# Patient Record
Sex: Female | Born: 1950 | Race: White | Hispanic: No | Smoking: Never smoker
Health system: Southern US, Community
[De-identification: ages and names within clinical notes are randomized; demographics above are authoritative.]

## PROBLEM LIST (undated history)

## (undated) DIAGNOSIS — F0280 Dementia in other diseases classified elsewhere without behavioral disturbance: Secondary | ICD-10-CM

## (undated) DIAGNOSIS — G20 Parkinson's disease: Secondary | ICD-10-CM

## (undated) DIAGNOSIS — G20A1 Parkinson's disease without dyskinesia, without mention of fluctuations: Secondary | ICD-10-CM

## (undated) DIAGNOSIS — G119 Hereditary ataxia, unspecified: Secondary | ICD-10-CM

## (undated) DIAGNOSIS — K759 Inflammatory liver disease, unspecified: Secondary | ICD-10-CM

---

## 2005-12-26 ENCOUNTER — Ambulatory Visit: Payer: Self-pay | Admitting: Psychiatry

## 2006-12-11 ENCOUNTER — Ambulatory Visit: Payer: Self-pay | Admitting: Family Medicine

## 2009-03-29 ENCOUNTER — Ambulatory Visit: Payer: Self-pay

## 2009-05-15 ENCOUNTER — Ambulatory Visit: Payer: Self-pay

## 2010-03-05 ENCOUNTER — Ambulatory Visit: Payer: Self-pay

## 2010-11-21 ENCOUNTER — Ambulatory Visit: Payer: Self-pay

## 2011-03-16 ENCOUNTER — Emergency Department: Payer: Self-pay | Admitting: Emergency Medicine

## 2011-03-27 ENCOUNTER — Ambulatory Visit: Payer: Self-pay | Admitting: Physician Assistant

## 2011-08-12 ENCOUNTER — Ambulatory Visit: Payer: Self-pay

## 2011-12-04 ENCOUNTER — Ambulatory Visit: Payer: Self-pay

## 2011-12-04 LAB — CBC WITH DIFFERENTIAL/PLATELET
Basophil #: 0.1 10*3/uL (ref 0.0–0.1)
Eosinophil #: 1.2 10*3/uL — ABNORMAL HIGH (ref 0.0–0.7)
Lymphocyte #: 3.4 10*3/uL (ref 1.0–3.6)
Lymphocyte %: 20 %
MCHC: 33.9 g/dL (ref 32.0–36.0)
Monocyte %: 10.8 %
Neutrophil %: 61.4 %
Platelet: 466 10*3/uL — ABNORMAL HIGH (ref 150–440)
RBC: 4.43 10*6/uL (ref 3.80–5.20)
RDW: 13.9 % (ref 11.5–14.5)

## 2012-11-30 ENCOUNTER — Ambulatory Visit: Payer: Self-pay | Admitting: Gastroenterology

## 2012-11-30 LAB — CBC WITH DIFFERENTIAL/PLATELET
Basophil #: 0.1 10*3/uL (ref 0.0–0.1)
Basophil %: 0.5 %
Eosinophil #: 0.1 10*3/uL (ref 0.0–0.7)
Eosinophil %: 1 %
HGB: 14.8 g/dL (ref 12.0–16.0)
Lymphocyte %: 14.2 %
MCV: 107 fL — ABNORMAL HIGH (ref 80–100)
Monocyte #: 1.2 x10 3/mm — ABNORMAL HIGH (ref 0.2–0.9)
Neutrophil #: 10.9 10*3/uL — ABNORMAL HIGH (ref 1.4–6.5)
Platelet: 442 10*3/uL — ABNORMAL HIGH (ref 150–440)
RBC: 4.14 10*6/uL (ref 3.80–5.20)
RDW: 13.7 % (ref 11.5–14.5)
WBC: 14.4 10*3/uL — ABNORMAL HIGH (ref 3.6–11.0)

## 2012-11-30 LAB — ALT: SGPT (ALT): 34 U/L (ref 12–78)

## 2013-02-09 ENCOUNTER — Ambulatory Visit: Payer: Self-pay | Admitting: Gastroenterology

## 2018-12-04 ENCOUNTER — Emergency Department
Admission: EM | Admit: 2018-12-04 | Discharge: 2018-12-04 | Discharge status: Absent without leave | Payer: Self-pay | Attending: Emergency Medicine | Admitting: Emergency Medicine

## 2018-12-04 ENCOUNTER — Emergency Department: Payer: Medicare Other

## 2018-12-04 ENCOUNTER — Inpatient Hospital Stay
Admission: EM | Admit: 2018-12-04 | Discharge: 2019-01-04 | DRG: 070 | Disposition: E | Payer: Medicare Other | Attending: Internal Medicine | Admitting: Internal Medicine

## 2018-12-04 ENCOUNTER — Other Ambulatory Visit: Payer: Self-pay

## 2018-12-04 DIAGNOSIS — E8809 Other disorders of plasma-protein metabolism, not elsewhere classified: Secondary | ICD-10-CM | POA: Diagnosis present

## 2018-12-04 DIAGNOSIS — B957 Other staphylococcus as the cause of diseases classified elsewhere: Secondary | ICD-10-CM | POA: Diagnosis present

## 2018-12-04 DIAGNOSIS — C7801 Secondary malignant neoplasm of right lung: Secondary | ICD-10-CM | POA: Diagnosis present

## 2018-12-04 DIAGNOSIS — C799 Secondary malignant neoplasm of unspecified site: Secondary | ICD-10-CM

## 2018-12-04 DIAGNOSIS — R7881 Bacteremia: Secondary | ICD-10-CM | POA: Diagnosis present

## 2018-12-04 DIAGNOSIS — Z66 Do not resuscitate: Secondary | ICD-10-CM | POA: Diagnosis present

## 2018-12-04 DIAGNOSIS — Z681 Body mass index (BMI) 19 or less, adult: Secondary | ICD-10-CM

## 2018-12-04 DIAGNOSIS — R6251 Failure to thrive (child): Secondary | ICD-10-CM | POA: Diagnosis present

## 2018-12-04 DIAGNOSIS — E876 Hypokalemia: Secondary | ICD-10-CM | POA: Diagnosis present

## 2018-12-04 DIAGNOSIS — C801 Malignant (primary) neoplasm, unspecified: Secondary | ICD-10-CM | POA: Diagnosis present

## 2018-12-04 DIAGNOSIS — L8990 Pressure ulcer of unspecified site, unspecified stage: Secondary | ICD-10-CM | POA: Insufficient documentation

## 2018-12-04 DIAGNOSIS — Z515 Encounter for palliative care: Secondary | ICD-10-CM | POA: Diagnosis present

## 2018-12-04 DIAGNOSIS — K5090 Crohn's disease, unspecified, without complications: Secondary | ICD-10-CM | POA: Diagnosis present

## 2018-12-04 DIAGNOSIS — R222 Localized swelling, mass and lump, trunk: Secondary | ICD-10-CM | POA: Diagnosis present

## 2018-12-04 DIAGNOSIS — Z7401 Bed confinement status: Secondary | ICD-10-CM

## 2018-12-04 DIAGNOSIS — L89150 Pressure ulcer of sacral region, unstageable: Secondary | ICD-10-CM | POA: Diagnosis present

## 2018-12-04 DIAGNOSIS — G9341 Metabolic encephalopathy: Secondary | ICD-10-CM | POA: Diagnosis not present

## 2018-12-04 DIAGNOSIS — L89222 Pressure ulcer of left hip, stage 2: Secondary | ICD-10-CM | POA: Diagnosis present

## 2018-12-04 DIAGNOSIS — L89212 Pressure ulcer of right hip, stage 2: Secondary | ICD-10-CM | POA: Diagnosis present

## 2018-12-04 DIAGNOSIS — R7401 Elevation of levels of liver transaminase levels: Secondary | ICD-10-CM | POA: Diagnosis present

## 2018-12-04 DIAGNOSIS — Z7189 Other specified counseling: Secondary | ICD-10-CM

## 2018-12-04 DIAGNOSIS — G20 Parkinson's disease: Secondary | ICD-10-CM | POA: Diagnosis present

## 2018-12-04 DIAGNOSIS — R4182 Altered mental status, unspecified: Secondary | ICD-10-CM | POA: Diagnosis not present

## 2018-12-04 DIAGNOSIS — G309 Alzheimer's disease, unspecified: Secondary | ICD-10-CM | POA: Diagnosis present

## 2018-12-04 DIAGNOSIS — G893 Neoplasm related pain (acute) (chronic): Secondary | ICD-10-CM

## 2018-12-04 DIAGNOSIS — J189 Pneumonia, unspecified organism: Secondary | ICD-10-CM | POA: Diagnosis present

## 2018-12-04 DIAGNOSIS — E43 Unspecified severe protein-calorie malnutrition: Secondary | ICD-10-CM | POA: Diagnosis present

## 2018-12-04 DIAGNOSIS — B182 Chronic viral hepatitis C: Secondary | ICD-10-CM | POA: Diagnosis present

## 2018-12-04 DIAGNOSIS — F0280 Dementia in other diseases classified elsewhere without behavioral disturbance: Secondary | ICD-10-CM | POA: Diagnosis present

## 2018-12-04 DIAGNOSIS — Z20828 Contact with and (suspected) exposure to other viral communicable diseases: Secondary | ICD-10-CM | POA: Diagnosis present

## 2018-12-04 DIAGNOSIS — R627 Adult failure to thrive: Secondary | ICD-10-CM | POA: Diagnosis present

## 2018-12-04 DIAGNOSIS — Z885 Allergy status to narcotic agent status: Secondary | ICD-10-CM

## 2018-12-04 DIAGNOSIS — E860 Dehydration: Secondary | ICD-10-CM | POA: Diagnosis present

## 2018-12-04 HISTORY — DX: Hereditary ataxia, unspecified: G11.9

## 2018-12-04 HISTORY — DX: Inflammatory liver disease, unspecified: K75.9

## 2018-12-04 HISTORY — DX: Dementia in other diseases classified elsewhere, unspecified severity, without behavioral disturbance, psychotic disturbance, mood disturbance, and anxiety: F02.80

## 2018-12-04 HISTORY — DX: Parkinson's disease: G20

## 2018-12-04 HISTORY — DX: Parkinson's disease without dyskinesia, without mention of fluctuations: G20.A1

## 2018-12-04 LAB — CBC WITH DIFFERENTIAL/PLATELET
Abs Immature Granulocytes: 0.13 10*3/uL — ABNORMAL HIGH (ref 0.00–0.07)
Basophils Absolute: 0.1 10*3/uL (ref 0.0–0.1)
Basophils Relative: 0 %
Eosinophils Absolute: 0 10*3/uL (ref 0.0–0.5)
Eosinophils Relative: 0 %
HCT: 43.4 % (ref 36.0–46.0)
Hemoglobin: 14.2 g/dL (ref 12.0–15.0)
Immature Granulocytes: 1 %
Lymphocytes Relative: 5 %
Lymphs Abs: 0.8 10*3/uL (ref 0.7–4.0)
MCH: 34.6 pg — ABNORMAL HIGH (ref 26.0–34.0)
MCHC: 32.7 g/dL (ref 30.0–36.0)
MCV: 105.9 fL — ABNORMAL HIGH (ref 80.0–100.0)
Monocytes Absolute: 1 10*3/uL (ref 0.1–1.0)
Monocytes Relative: 6 %
Neutro Abs: 14.2 10*3/uL — ABNORMAL HIGH (ref 1.7–7.7)
Neutrophils Relative %: 88 %
Platelets: 194 10*3/uL (ref 150–400)
RBC: 4.1 MIL/uL (ref 3.87–5.11)
RDW: 16.4 % — ABNORMAL HIGH (ref 11.5–15.5)
WBC: 16.2 10*3/uL — ABNORMAL HIGH (ref 4.0–10.5)
nRBC: 0 % (ref 0.0–0.2)

## 2018-12-04 LAB — COMPREHENSIVE METABOLIC PANEL
ALT: 6 U/L (ref 0–44)
AST: 50 U/L — ABNORMAL HIGH (ref 15–41)
Albumin: 2.8 g/dL — ABNORMAL LOW (ref 3.5–5.0)
Alkaline Phosphatase: 129 U/L — ABNORMAL HIGH (ref 38–126)
Anion gap: 21 — ABNORMAL HIGH (ref 5–15)
BUN: 23 mg/dL (ref 8–23)
CO2: 21 mmol/L — ABNORMAL LOW (ref 22–32)
Calcium: 12.4 mg/dL — ABNORMAL HIGH (ref 8.9–10.3)
Chloride: 92 mmol/L — ABNORMAL LOW (ref 98–111)
Creatinine, Ser: 0.62 mg/dL (ref 0.44–1.00)
GFR calc Af Amer: >60 mL/min (ref >60)
GFR calc non Af Amer: >60 mL/min (ref >60)
Glucose, Bld: 94 mg/dL (ref 70–99)
Potassium: 4 mmol/L (ref 3.5–5.1)
Sodium: 134 mmol/L — ABNORMAL LOW (ref 135–145)
Total Bilirubin: 1.6 mg/dL — ABNORMAL HIGH (ref 0.3–1.2)
Total Protein: 6.8 g/dL (ref 6.5–8.1)

## 2018-12-04 LAB — CK: Total CK: 49 U/L (ref 38–234)

## 2018-12-04 LAB — SEDIMENTATION RATE: Sed Rate: 13 mm/hr (ref 0–30)

## 2018-12-04 LAB — SARS CORONAVIRUS 2 BY RT PCR (HOSPITAL ORDER, PERFORMED IN ~~LOC~~ HOSPITAL LAB): SARS Coronavirus 2: NEGATIVE

## 2018-12-04 MED ORDER — SODIUM CHLORIDE 0.9 % IV SOLN
1.0000 g | Freq: Once | INTRAVENOUS | Status: AC
  Administered 2018-12-04: 1 g via INTRAVENOUS
  Filled 2018-12-04: qty 1

## 2018-12-04 MED ORDER — IOHEXOL 300 MG/ML  SOLN
50.0000 mL | Freq: Once | INTRAMUSCULAR | Status: AC | PRN
  Administered 2018-12-04: 50 mL via INTRAVENOUS

## 2018-12-04 NOTE — ED Provider Notes (Addendum)
[facility] Emergency Department Provider Note   ____________________________________________   First MD Initiated Contact with Patient 11/26/2018 2011     (approximate)  I have reviewed the triage vital signs and the nursing notes.   HISTORY  Chief Complaint medical clearance Chief complaint is altered mental status this limits my ability to get the patient's history  HPI Sandra Maddox is a 68 y.o. female who comes from home.  Apparently protective services was called.  Patient has end-stage Parkinson's.  Patient has been bedbound.  She has about 4 hours of home health a day.  Bedbugs were present for EMS.  Patient is unable to provide any history due to her altered mental status.  She looks cachectic.   Patient is a no code on hospice care.  She had wanted to die at home but was unable to sign the paperwork and time.  She had been ambulatory for 3 weeks ago.      Past Medical History:  Diagnosis Date   Alzheimer disease ([place])    Cerebellar ataxia ([place])    Hepatitis    Parkinson disease ([place])    Past history is Crohn's disease hepatitis C and end-stage Parkinson's. There are no active problems to display for this patient.     Prior to Admission medications   Not on File    Allergies Codeine and Morphine and related  No family history on file.  Social History Social History   Tobacco Use   Smoking status: Never Smoker   Smokeless tobacco: Never Used  Substance Use Topics   Alcohol use: Not Currently   Drug use: Not Currently    Review of Systems Unable to obtain ____________________________________________   PHYSICAL EXAM:  VITAL SIGNS: ED Triage Vitals  Enc Vitals Group     BP      Pulse      Resp      Temp      Temp src      SpO2      Weight      Height      Head Circumference      Peak Flow      Pain Score      Pain Loc      Pain Edu?      Excl. in [place]?     Constitutional: Alert alert mumbling  but not speaking clearly.  Does not appear to be in any distress. Eyes: Conjunctivae are normal.  Head: Atraumatic. Nose: No congestion/rhinnorhea. Mouth/Throat: Mucous membranes are moist.  Patient not opening mouth well.Marland Kitchen  Poor dentition Neck: No stridor. Cardiovascular: Normal rate, regular rhythm. Grossly normal heart sounds.  Good peripheral circulation. Respiratory: Normal respiratory effort.  No retractions. Lungs CTAB. Gastrointestinal: Soft and nontender. No distention. No abdominal bruits. No CVA tenderness.  Patient has a sacral decubitus with some necrosis.  About the size of the palm of her hand. Musculoskeletal: No lower extremity tenderness nor edema.  No joint effusions.   ____________________________________________   LABS (all labs ordered are listed, but only abnormal results are displayed)  Labs Reviewed  COMPREHENSIVE METABOLIC PANEL - Abnormal; Notable for the following components:      Result Value   Sodium 134 (*)    Chloride 92 (*)    CO2 21 (*)    Calcium 12.4 (*)    Albumin 2.8 (*)    AST 50 (*)    Alkaline Phosphatase 129 (*)    Total Bilirubin 1.6 (*)  Anion gap 21 (*)    All other components within normal limits  CBC WITH DIFFERENTIAL/PLATELET - Abnormal; Notable for the following components:   WBC 16.2 (*)    MCV 105.9 (*)    MCH 34.6 (*)    RDW 16.4 (*)    Neutro Abs 14.2 (*)    Abs Immature Granulocytes 0.13 (*)    All other components within normal limits  SARS CORONAVIRUS 2 BY RT PCR (HOSPITAL ORDER, [place] [facility])  CULTURE, BLOOD (ROUTINE X 2)  CULTURE, BLOOD (ROUTINE X 2)  CK  SEDIMENTATION RATE  URINALYSIS, COMPLETE (UACMP) WITH MICROSCOPIC  URINE DRUG SCREEN, QUALITATIVE ([facility] ONLY)   ____________________________________________  EKG   ____________________________________________  RADIOLOGY  ED MD interpretation:   Official radiology report(s): Ct Head Wo Contrast  Result Date:  11/06/2018 CLINICAL DATA:  Altered level of consciousness. EXAM: CT HEAD WITHOUT CONTRAST TECHNIQUE: Contiguous axial images were obtained from the base of the skull through the vertex without intravenous contrast. COMPARISON:  None. FINDINGS: Brain: No evidence of acute infarction, hemorrhage, hydrocephalus, extra-axial collection or mass lesion/mass effect. There is atrophy greater than expected for the patient's age. Vascular: No hyperdense vessel or unexpected calcification. Skull: Normal. Negative for fracture or focal lesion. Sinuses/Orbits: No acute finding. Other: None. IMPRESSION: No acute intracranial abnormality. Electronically Signed   By: Constance Holster M.D.   On: 11/24/2018 22:39   Ct Chest Wo Contrast  Result Date: 11/04/2018 CLINICAL DATA:  Unintended weight loss, sacral ulcer, nonlocalizing abdominal pain, shortness of breath possible pneumonia EXAM: CT CHEST, ABDOMEN AND PELVIS WITHOUT CONTRAST TECHNIQUE: Multidetector CT imaging of the chest, abdomen and pelvis was performed following the standard protocol without IV contrast. Sagittal and coronal MPR images reconstructed from axial data set. No oral or IV contrast administered. COMPARISON:  Chest radiograph 11/28/2018 FINDINGS: CT CHEST FINDINGS Cardiovascular: Atherosclerotic calcifications aorta, proximal great vessels, minimally in coronary arteries. Heart normal size. No pericardial effusion. Mediastinum/Nodes: Esophagus unremarkable. Base of cervical region normal appearance. No thoracic adenopathy. Lungs/Pleura: Emphysematous changes and central peribronchial thickening consistent with COPD. Large soft tissue mass identified at the posteromedial RIGHT hemithorax, 9.2 x 5.9 x 10.0 cm in size, invading the posterior chest wall over a long segment as well as invading 4 vertebral segments of the thoracic spine. Mass enters the spinal canal and causes significant narrowing and compression of the thecal sac, clinically consider cord  compression. Mild scattered interstitial changes in the lower lobes bilaterally greater on RIGHT, minimally in the anterolateral LEFT upper lobe. Minimal scarring at the anterolateral periphery of the RIGHT middle lobe. No additional infiltrate, pleural effusion or pneumothorax. Musculoskeletal: Bone destruction of the RIGHT seventh eighth ninth and tenth vertebral bodies along the RIGHT side extending across the midline at the seventh eighth and ninth vertebra. Marked compression deformity of the T8 vertebral body. Mild superior endplate compression fracture of the superior endplate of T9. Bone destruction is also seen extending into the posterior elements at T7 and T8 as well as the RIGHT transverse processes of T6, T7, T8, T9. 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 CT ABDOMEN PELVIS FINDINGS Hepatobiliary: Gallbladder mildly distended but otherwise unremarkable. No biliary dilatation. Poorly defined mass at anterolateral liver likely metastatic lesion 4.2 x 2.7 cm image 17. Pancreas: Mildly atrophic pancreas without mass Spleen: Normal appearance Adrenals/Urinary Tract: Adrenal glands normal appearance. Tiny nonobstructing LEFT renal calculus. Kidneys, ureters, and bladder normal appearance Stomach/Bowel: Markedly increased stool in rectum. Stomach decompressed.  Unable to exclude wall thickening of the distal gastric antrum/pyloric region. Remaining bowel loops unremarkable. Vascular/Lymphatic: Atherosclerotic calcifications aorta and extensively and iliac arteries without aneurysm. Significant thrombus in the distal abdominal aorta markedly narrowing the lumen. No adenopathy. Reproductive: Significant fluid distends the upper endometrial canal with thinning of the myometrium. Atrophic ovaries. Other: No free air free fluid. No hernia. Musculoskeletal: Diffuse osseous demineralization. Compression fractures of inferior L1 and superior L2. Scoliosis and degenerative changes of the  lumbar spine. IMPRESSION: Large soft tissue mass at the posteromedial RIGHT hemithorax 9.2 x 5.9 x 10.0 cm in size, invading the posterior chest wall over a long segment, causing destruction of RIGHT seventh through tenth ribs and the RIGHT lateral aspect of the seventh through tenth thoracic vertebra. Extension of tumor into spinal canal causing marked compression and narrowing of the thecal sac, clinically consider cord compression. Suspected metastatic lesion at anterolateral liver 4.2 x 2.7 cm. Markedly increased stool in rectum. Tiny nonobstructing LEFT renal calculus. Significant fluid distends the upper endometrial canal with thinning of the myometrium; this could be assessed by follow-up pelvic sonography, if clinically indicated based on patient condition. Aortic Atherosclerosis (ICD10-I70.0) and Emphysema (ICD10-J43.9). Findings called to Dr. Cinda Quest On 11/25/2018 at 2253 hours. Electronically Signed   By: Lavonia Dana M.D.   On: 11/22/2018 22:57   Ct Abdomen Pelvis W Contrast  Result Date: 11/24/2018 CLINICAL DATA:  Unintended weight loss, sacral ulcer, nonlocalizing abdominal pain, shortness of breath possible pneumonia EXAM: CT CHEST, ABDOMEN AND PELVIS WITHOUT CONTRAST TECHNIQUE: Multidetector CT imaging of the chest, abdomen and pelvis was performed following the standard protocol without IV contrast. Sagittal and coronal MPR images reconstructed from axial data set. No oral or IV contrast administered. COMPARISON:  Chest radiograph 11/04/2018 FINDINGS: CT CHEST FINDINGS Cardiovascular: Atherosclerotic calcifications aorta, proximal great vessels, minimally in coronary arteries. Heart normal size. No pericardial effusion. Mediastinum/Nodes: Esophagus unremarkable. Base of cervical region normal appearance. No thoracic adenopathy. Lungs/Pleura: Emphysematous changes and central peribronchial thickening consistent with COPD. Large soft tissue mass identified at the posteromedial RIGHT hemithorax,  9.2 x 5.9 x 10.0 cm in size, invading the posterior chest wall over a long segment as well as invading 4 vertebral segments of the thoracic spine. Mass enters the spinal canal and causes significant narrowing and compression of the thecal sac, clinically consider cord compression. Mild scattered interstitial changes in the lower lobes bilaterally greater on RIGHT, minimally in the anterolateral LEFT upper lobe. Minimal scarring at the anterolateral periphery of the RIGHT middle lobe. No additional infiltrate, pleural effusion or pneumothorax. Musculoskeletal: Bone destruction of the RIGHT seventh eighth ninth and tenth vertebral bodies along the RIGHT side extending across the midline at the seventh eighth and ninth vertebra. Marked compression deformity of the T8 vertebral body. Mild superior endplate compression fracture of the superior endplate of T9. Bone destruction is also seen extending into the posterior elements at T7 and T8 as well as the RIGHT transverse processes of T6, T7, T8, T9. 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 CT ABDOMEN PELVIS FINDINGS Hepatobiliary: Gallbladder mildly distended but otherwise unremarkable. No biliary dilatation. Poorly defined mass at anterolateral liver likely metastatic lesion 4.2 x 2.7 cm image 17. Pancreas: Mildly atrophic pancreas without mass Spleen: Normal appearance Adrenals/Urinary Tract: Adrenal glands normal appearance. Tiny nonobstructing LEFT renal calculus. Kidneys, ureters, and bladder normal appearance Stomach/Bowel: Markedly increased stool in rectum. Stomach decompressed. Unable to exclude wall thickening of the distal gastric antrum/pyloric region. Remaining bowel loops unremarkable.  Vascular/Lymphatic: Atherosclerotic calcifications aorta and extensively and iliac arteries without aneurysm. Significant thrombus in the distal abdominal aorta markedly narrowing the lumen. No adenopathy. Reproductive: Significant fluid distends the  upper endometrial canal with thinning of the myometrium. Atrophic ovaries. Other: No free air free fluid. No hernia. Musculoskeletal: Diffuse osseous demineralization. Compression fractures of inferior L1 and superior L2. Scoliosis and degenerative changes of the lumbar spine. IMPRESSION: Large soft tissue mass at the posteromedial RIGHT hemithorax 9.2 x 5.9 x 10.0 cm in size, invading the posterior chest wall over a long segment, causing destruction of RIGHT seventh through tenth ribs and the RIGHT lateral aspect of the seventh through tenth thoracic vertebra. Extension of tumor into spinal canal causing marked compression and narrowing of the thecal sac, clinically consider cord compression. Suspected metastatic lesion at anterolateral liver 4.2 x 2.7 cm. Markedly increased stool in rectum. Tiny nonobstructing LEFT renal calculus. Significant fluid distends the upper endometrial canal with thinning of the myometrium; this could be assessed by follow-up pelvic sonography, if clinically indicated based on patient condition. Aortic Atherosclerosis (ICD10-I70.0) and Emphysema (ICD10-J43.9). Findings called to Dr. Cinda Quest On 11/08/2018 at 2253 hours. Electronically Signed   By: Lavonia Dana M.D.   On: 11/09/2018 22:57   Dg Chest Portable 1 View  Result Date: 11/05/2018 CLINICAL DATA:  Weakness. EXAM: PORTABLE CHEST 1 VIEW COMPARISON:  None. FINDINGS: No pneumothorax. Prominence of the right hilum with perihilar haziness is identified. The heart, left hilum, and mediastinum are normal. No other acute abnormalities are identified. IMPRESSION: Prominence of the right hilum with right perihilar haziness. This could represent infiltrate/pneumonia versus a mass. Recommend a CT scan for further evaluation. Electronically Signed   By: Dorise Bullion III M.D   On: 11/20/2018 21:04    ____________________________________________   PROCEDURES  Procedure(s) performed (including Critical  Care):  Procedures   ____________________________________________   INITIAL IMPRESSION / ASSESSMENT AND PLAN / ED COURSE  Sandra Maddox was evaluated in Emergency Department on 11/26/2018 for the symptoms described in the history of present illness. She was evaluated in the context of the global COVID-19 pandemic, which necessitated consideration that the patient might be at risk for infection with the SARS-CoV-2 virus that causes COVID-19. Institutional protocols and algorithms that pertain to the evaluation of patients at risk for COVID-19 are in a state of rapid change based on information released by regulatory bodies including the CDC and federal and state organizations. These policies and algorithms were followed during the patient's care in the ED.    Patient has metastatic cancer on her CT scans.  She is a DNR and on hospice.  Until her living situation get straightened out we will keep her in the hospital.  Comfort care only.  Discussed with the hospitalist.      Clinical Course as of Dec 04 2302  Sat Dec 04, 2018  2108 CT PELVIS W CONTRAST [PM]    Clinical Course User Index [PM] Nena Polio, MD     ____________________________________________   FINAL CLINICAL IMPRESSION(S) / ED DIAGNOSES  Final diagnoses:  Metastatic malignant neoplasm, unspecified site [facility])     ED Discharge Orders    None       Note:  This document was prepared using Dragon voice recognition software and may include unintentional dictation errors.    Nena Polio, MD 11/04/2018 [id number]    Nena Polio, MD 12/05/18 [phone]

## 2018-12-04 NOTE — ED Notes (Signed)
Spoke with Edd Arbour who is the APS worker on call. Pt friend [name] has been taking care of pt and pt stated that she wanted to die at home. Hospice has become involved and APS was given a report by someone. Pt is not alert and oriented. Believed that pt is actively dying. Hospice RN of [facility] is to call this RN with medical info.

## 2018-12-04 NOTE — ED Notes (Signed)
Spoke with Hospice RN Ashby Dawes. She was able to give history and status. Pt wanted to die at home. Friend [name] was to be POA but paperwork was never completed. [name] is unable to care for pt full time. Hospice RN states that py has gotten progressively worse over the last few weeks. Pt was ambulatory 3 weeks ago. Pt has lost 40 pounds since March.

## 2018-12-05 DIAGNOSIS — J189 Pneumonia, unspecified organism: Secondary | ICD-10-CM

## 2018-12-05 DIAGNOSIS — C801 Malignant (primary) neoplasm, unspecified: Secondary | ICD-10-CM | POA: Diagnosis present

## 2018-12-05 DIAGNOSIS — Z20828 Contact with and (suspected) exposure to other viral communicable diseases: Secondary | ICD-10-CM | POA: Diagnosis present

## 2018-12-05 DIAGNOSIS — G309 Alzheimer's disease, unspecified: Secondary | ICD-10-CM | POA: Diagnosis present

## 2018-12-05 DIAGNOSIS — Z681 Body mass index (BMI) 19 or less, adult: Secondary | ICD-10-CM | POA: Diagnosis not present

## 2018-12-05 DIAGNOSIS — G20 Parkinson's disease: Secondary | ICD-10-CM | POA: Diagnosis present

## 2018-12-05 DIAGNOSIS — R627 Adult failure to thrive: Secondary | ICD-10-CM | POA: Diagnosis not present

## 2018-12-05 DIAGNOSIS — E8809 Other disorders of plasma-protein metabolism, not elsewhere classified: Secondary | ICD-10-CM | POA: Diagnosis present

## 2018-12-05 DIAGNOSIS — G9341 Metabolic encephalopathy: Secondary | ICD-10-CM | POA: Diagnosis present

## 2018-12-05 DIAGNOSIS — R222 Localized swelling, mass and lump, trunk: Secondary | ICD-10-CM | POA: Diagnosis present

## 2018-12-05 DIAGNOSIS — B957 Other staphylococcus as the cause of diseases classified elsewhere: Secondary | ICD-10-CM | POA: Diagnosis present

## 2018-12-05 DIAGNOSIS — B182 Chronic viral hepatitis C: Secondary | ICD-10-CM | POA: Diagnosis present

## 2018-12-05 DIAGNOSIS — R7401 Elevation of levels of liver transaminase levels: Secondary | ICD-10-CM

## 2018-12-05 DIAGNOSIS — L89150 Pressure ulcer of sacral region, unstageable: Secondary | ICD-10-CM | POA: Diagnosis present

## 2018-12-05 DIAGNOSIS — K5090 Crohn's disease, unspecified, without complications: Secondary | ICD-10-CM | POA: Diagnosis present

## 2018-12-05 DIAGNOSIS — R4182 Altered mental status, unspecified: Secondary | ICD-10-CM | POA: Diagnosis present

## 2018-12-05 DIAGNOSIS — E860 Dehydration: Secondary | ICD-10-CM

## 2018-12-05 DIAGNOSIS — E876 Hypokalemia: Secondary | ICD-10-CM | POA: Diagnosis present

## 2018-12-05 DIAGNOSIS — L89159 Pressure ulcer of sacral region, unspecified stage: Secondary | ICD-10-CM | POA: Insufficient documentation

## 2018-12-05 DIAGNOSIS — R6251 Failure to thrive (child): Secondary | ICD-10-CM | POA: Diagnosis not present

## 2018-12-05 DIAGNOSIS — R7881 Bacteremia: Secondary | ICD-10-CM | POA: Diagnosis present

## 2018-12-05 DIAGNOSIS — E43 Unspecified severe protein-calorie malnutrition: Secondary | ICD-10-CM | POA: Diagnosis present

## 2018-12-05 DIAGNOSIS — C799 Secondary malignant neoplasm of unspecified site: Secondary | ICD-10-CM | POA: Diagnosis not present

## 2018-12-05 DIAGNOSIS — Z515 Encounter for palliative care: Secondary | ICD-10-CM | POA: Diagnosis not present

## 2018-12-05 DIAGNOSIS — C7801 Secondary malignant neoplasm of right lung: Secondary | ICD-10-CM | POA: Diagnosis present

## 2018-12-05 DIAGNOSIS — Z66 Do not resuscitate: Secondary | ICD-10-CM | POA: Diagnosis present

## 2018-12-05 DIAGNOSIS — Z7189 Other specified counseling: Secondary | ICD-10-CM | POA: Diagnosis not present

## 2018-12-05 DIAGNOSIS — G20A1 Parkinson's disease without dyskinesia, without mention of fluctuations: Secondary | ICD-10-CM | POA: Diagnosis present

## 2018-12-05 DIAGNOSIS — L89212 Pressure ulcer of right hip, stage 2: Secondary | ICD-10-CM | POA: Diagnosis present

## 2018-12-05 DIAGNOSIS — L89222 Pressure ulcer of left hip, stage 2: Secondary | ICD-10-CM | POA: Diagnosis present

## 2018-12-05 DIAGNOSIS — F0280 Dementia in other diseases classified elsewhere without behavioral disturbance: Secondary | ICD-10-CM | POA: Diagnosis present

## 2018-12-05 LAB — BLOOD CULTURE ID PANEL (REFLEXED)

## 2018-12-05 LAB — CBC
HCT: 42.3 % (ref 36.0–46.0)
Hemoglobin: 13.6 g/dL (ref 12.0–15.0)
MCH: 34.3 pg — ABNORMAL HIGH (ref 26.0–34.0)
MCHC: 32.2 g/dL (ref 30.0–36.0)
MCV: 106.8 fL — ABNORMAL HIGH (ref 80.0–100.0)
Platelets: 156 10*3/uL (ref 150–400)
RBC: 3.96 MIL/uL (ref 3.87–5.11)
RDW: 16.3 % — ABNORMAL HIGH (ref 11.5–15.5)
WBC: 15.3 10*3/uL — ABNORMAL HIGH (ref 4.0–10.5)
nRBC: 0 % (ref 0.0–0.2)

## 2018-12-05 LAB — BASIC METABOLIC PANEL
Anion gap: 17 — ABNORMAL HIGH (ref 5–15)
BUN: 19 mg/dL (ref 8–23)
CO2: 17 mmol/L — ABNORMAL LOW (ref 22–32)
Calcium: 11.2 mg/dL — ABNORMAL HIGH (ref 8.9–10.3)
Chloride: 104 mmol/L (ref 98–111)
Creatinine, Ser: 0.46 mg/dL (ref 0.44–1.00)
GFR calc Af Amer: >60 mL/min (ref >60)
GFR calc non Af Amer: >60 mL/min (ref >60)
Glucose, Bld: 61 mg/dL — ABNORMAL LOW (ref 70–99)
Potassium: 3.7 mmol/L (ref 3.5–5.1)
Sodium: 138 mmol/L (ref 135–145)

## 2018-12-05 LAB — PROCALCITONIN: Procalcitonin: 0.42 ng/mL

## 2018-12-05 LAB — COMPREHENSIVE METABOLIC PANEL
ALT: 8 U/L (ref 0–44)
AST: 48 U/L — ABNORMAL HIGH (ref 15–41)
Albumin: 2.7 g/dL — ABNORMAL LOW (ref 3.5–5.0)
Alkaline Phosphatase: 115 U/L (ref 38–126)
Anion gap: 22 — ABNORMAL HIGH (ref 5–15)
BUN: 21 mg/dL (ref 8–23)
CO2: 18 mmol/L — ABNORMAL LOW (ref 22–32)
Calcium: 11.8 mg/dL — ABNORMAL HIGH (ref 8.9–10.3)
Chloride: 98 mmol/L (ref 98–111)
Creatinine, Ser: 0.46 mg/dL (ref 0.44–1.00)
GFR calc Af Amer: >60 mL/min (ref >60)
GFR calc non Af Amer: >60 mL/min (ref >60)
Glucose, Bld: 56 mg/dL — ABNORMAL LOW (ref 70–99)
Potassium: 3.5 mmol/L (ref 3.5–5.1)
Sodium: 138 mmol/L (ref 135–145)
Total Bilirubin: 1.6 mg/dL — ABNORMAL HIGH (ref 0.3–1.2)
Total Protein: 6.6 g/dL (ref 6.5–8.1)

## 2018-12-05 LAB — HIV ANTIBODY (ROUTINE TESTING W REFLEX): HIV Screen 4th Generation wRfx: NONREACTIVE

## 2018-12-05 MED ORDER — SODIUM CHLORIDE 0.9 % IV SOLN
Freq: Once | INTRAVENOUS | Status: AC
  Administered 2018-12-05: 09:00:00 via INTRAVENOUS

## 2018-12-05 MED ORDER — VANCOMYCIN HCL 1.5 G IV SOLR
1500.0000 mg | Freq: Once | INTRAVENOUS | Status: DC
  Filled 2018-12-05: qty 1500

## 2018-12-05 MED ORDER — HYDROMORPHONE HCL 1 MG/ML IJ SOLN
0.5000 mg | INTRAMUSCULAR | Status: DC | PRN
  Administered 2018-12-05 – 2018-12-10 (×5): 0.5 mg via INTRAVENOUS
  Filled 2018-12-05 (×5): qty 1

## 2018-12-05 MED ORDER — ENOXAPARIN SODIUM 30 MG/0.3ML ~~LOC~~ SOLN
30.0000 mg | SUBCUTANEOUS | Status: DC
  Administered 2018-12-05 – 2018-12-09 (×5): 30 mg via SUBCUTANEOUS
  Filled 2018-12-05 (×6): qty 0.3

## 2018-12-05 MED ORDER — SODIUM CHLORIDE 0.9 % IV SOLN
Freq: Once | INTRAVENOUS | Status: AC
  Administered 2018-12-05: 1000 mL via INTRAVENOUS

## 2018-12-05 MED ORDER — SODIUM CHLORIDE 0.9 % IV SOLN
1.5000 g | Freq: Four times a day (QID) | INTRAVENOUS | Status: DC
  Administered 2018-12-05 – 2018-12-09 (×16): 1.5 g via INTRAVENOUS
  Filled 2018-12-05: qty 4
  Filled 2018-12-05: qty 1.5
  Filled 2018-12-05 (×4): qty 4
  Filled 2018-12-05 (×3): qty 1.5
  Filled 2018-12-05: qty 4
  Filled 2018-12-05 (×6): qty 1.5
  Filled 2018-12-05: qty 4
  Filled 2018-12-05: qty 1.5
  Filled 2018-12-05: qty 4
  Filled 2018-12-05: qty 1.5
  Filled 2018-12-05: qty 4

## 2018-12-05 MED ORDER — SODIUM CHLORIDE 0.9 % IV SOLN
INTRAVENOUS | Status: DC
  Administered 2018-12-05 – 2018-12-06 (×4): via INTRAVENOUS

## 2018-12-05 NOTE — Progress Notes (Signed)
Patient admitted to IP after midnight and care began before midnight.   68 yo hx end stage parkinsons, chron's, hepatitis c, ?dementia admitted acute encephalopathy due to multifactorial reasons. [name] is friend who "checks up" on patients. She reports that last 6 weeks patient stopped eating, drinking, smoking and has increased falls. [name] took patient to Dr. Clemmie Krill after fall last week and she complained "rib" pain. Dr. Clemmie Krill notified adult protective services as patient lives alone and needs case worker as well as hospice.   Work up in ED reveals large mass right hemithorax with ?pneumonia, hypercalcemia, dehydration, elevated transaminase sacral decub, failure to thrive. Note also indicates that patient will be assigned permanent case worker and placement with hospice will be started.   In meantime will request palliative care consult for goals of care.      Dyanne Carrel, NP

## 2018-12-05 NOTE — ED Notes (Signed)
Patient is resting comfortably. 

## 2018-12-05 NOTE — ED Notes (Signed)
Pt shouting out; in to check on pt; all I can understand is "help me"; asked pt if she was in pain, no answer; asked pt if she needed to void, no answer; pt became quiet after asked questions;

## 2018-12-05 NOTE — Progress Notes (Signed)
   12/05/18 1000  Clinical Encounter Type  Visited With Patient  Visit Type Initial;ED  Referral From Nurse  Spiritual Encounters  Spiritual Needs [facility] text;Emotional;Other (Comment)  Stress Factors  Patient Stress Factors Lack of knowledge;Loss;Other (Comment)

## 2018-12-05 NOTE — ED Notes (Signed)
RN attempted to give some juice to pt, pt pushed Rn hand away

## 2018-12-05 NOTE — ED Notes (Signed)
Pt's brief changed d/t soilage. Unstable pressure ulcer present to sacrum, reddened areas of skin to bil hips, L worse than R. Pt positioned on back at this time. Foam sacral dressing changed. Pt moved to hospital bed.

## 2018-12-05 NOTE — ED Notes (Signed)
Eyes closed, resp even and unlabored 

## 2018-12-05 NOTE — ED Notes (Signed)
Pt incontinent of a large amount of urine; cleaned well by this RN and Dawn, RN; pt noted to have a a decubitus ulcer to sacrum that cannot be staged; duoderm placed; while changing, pt saying "help me, help me" but unable to understand anything else pt is saying; pt's skin is dusky, poor skin turgor; emaciated;

## 2018-12-05 NOTE — Progress Notes (Signed)
Pharmacy Antibiotic Note  Sandra Maddox is a 68 y.o. female admitted on 11/12/2018 with pneumonia.  Pharmacy has been consulted for Unasyn dosing.  Plan: Unasyn 1.5gm IV q6 hrs  Height: 5\' 2"  (157.5 cm) Weight: 66 lb 4 oz (30.1 kg) IBW/kg (Calculated) : 50.1  Temp (24hrs), Avg:97.5 F (36.4 C), Min:97.5 F (36.4 C), Max:97.5 F (36.4 C)  Recent Labs  Lab 11/21/2018 2014  WBC 16.2*  CREATININE 0.62    Estimated Creatinine Clearance: 32.4 mL/min (by C-G formula based on SCr of 0.62 mg/dL).    Allergies  Allergen Reactions  . Codeine   . Morphine And Related     Antimicrobials this admission: Cefepime 10/31 >>  Unasyn 11/1 >>   Dose adjustments this admission:   Microbiology results:  BCx:   UCx:    Sputum:    MRSA PCR:   Thank you for allowing pharmacy to be a part of this patient's care.  Hart Robinsons A 12/05/2018 4:30 AM

## 2018-12-05 NOTE — ED Notes (Signed)
Pt unwilling or unable to open eyes. Pt sleeping soundly and is not responsive to voice at this time.

## 2018-12-05 NOTE — ED Notes (Addendum)
Adult protective services called for an update on pt status- informed them that pt was being admitted- they stated that tomorrow they would assign a permanent social worker to her case and begin looking for placement and wanted to make sure she was safe this weekend- also stated that pt's friend Sandra Maddox may come by to be her one visitor and that would be okay- Eyvonne Mechanic RN made aware of phone conversation

## 2018-12-05 NOTE — TOC Initial Note (Signed)
Transition of Care [facility]) - Initial/Assessment Note    Patient Details  Name: Sandra Maddox MRN: [id number] Date of Birth: [birth year]  Transition of Care [facility] CM/SW Contact:    Berenice Bouton, LCSW Phone Number: 12/05/2018, 4:43 PM  Clinical Narrative:   Social work consult received for assistance with placement.  The patient is a 68 year old female who presented to [facility] ED due to altered mental status. Patient is not able to participate in assessment due to progressive illness.  Information taken from chart records.  SW called [facility] who confirmed that the patient is under their care for home hospice. Per EPIC chart records this patient's wishes to die in her home. Patient lives alone and no known relatives.  Patient has an active adult protective service case with [place] DSS.  SW was not able to reach [place] or on-call social worker.                Expected Discharge Plan: [place] Barriers to Discharge: Continued Medical Work up(Palleative consult ordered)  Patient Goals and CMS Choice Patient states their goals for this hospitalization and ongoing recovery are:: Patient nonverbal/non communicative - per notes in chart and phone consult with [facility] this patient wishes to die at home.   Choice offered to / list presented to : NA  Expected Discharge Plan and Services Expected Discharge Plan: [place]     Post Acute Care Choice: Hospice(Pt is receiving hospice care at home with [facility]) Living arrangements for the past 2 months: Single Family Home                    HH Arranged: RN, Social Work, Nurse's Aide, [place] Agency: [employer] spoke with at [place]: Esther Hardy  Prior Living Arrangements/Services Living arrangements for the past 2 months: [place] with:: Self Patient language and need for interpreter reviewed::  No Do you feel safe going back to the place where you live?: Yes      Need for Family Participation in Patient Care: Yes (Comment)(No family member listed in this pt's file.  Has a friend Laverna Peace [phone])   Current home services: ([facility] at home) Criminal Activity/Legal Involvement Pertinent to Current Situation/Hospitalization: No - Comment as needed  Activities of Daily Living     Permission Sought/Granted Permission sought to share information with : Case Manager, Customer service manager Permission granted to share information with : (Pt is non verbal.  Not responding.)      Emotional Assessment Appearance:: Appears older than stated age Attitude/Demeanor/Rapport: Unresponsive Affect (typically observed): Unable to Assess   Alcohol / Substance Use: Not Applicable Psych Involvement: No (comment)  Admission diagnosis:  Altered Mental Status  Patient Active Problem List   Diagnosis Date Noted  . Altered mental status 12/05/2018  . Hypercalcemia 12/05/2018  . Dehydration 12/05/2018  . Mass in chest 12/05/2018  . Failure to thrive (0-17) 12/05/2018  . Hypoalbuminemia 12/05/2018  . Chronic hepatitis C without hepatic coma ([place]) 12/05/2018  . Transaminitis 12/05/2018  . Sacral decubitus ulcer 12/05/2018  . Community acquired pneumonia 12/05/2018  . Parkinson disease ([place]) 12/05/2018   PCP:  Patient, No Pcp Per Pharmacy:   [id number] - [place], [place] [address] Phone: [phone] Fax: [phone]     Social Determinants of Health (SDOH) Interventions  Readmission Risk Interventions No flowsheet data found.

## 2018-12-05 NOTE — Social Work (Signed)
TOC CM/SW consult received. SW consult in progress.    Berenice Bouton, MSW, LCSW  [phone] 8am-6pm (weekends) or CSW ED # [phone]

## 2018-12-05 NOTE — H&P (Signed)
History and Physical  Sandra Maddox [id number] DOB: [birth year] DOA: 11/25/2018  Referring physician: Dr. Cinda Quest Patient coming from: Home Chief Complaint:   HPI: Sandra Maddox is a 68 y.o. female with medical history significant for crohn's disease, Parkinson's disease and hepatitis C who presents to the ED via EMS from home due to altered mental status. History cannot be obtained from patient due to altered mental status, history was obtained from ED physician, nurse and ED Chart. Per report, Patient lives alone, but she has a friend(Anita) that has been taking care of her, patient was said to be on Hospice and that she will prefer to die at home, Hospice was involved and adult protective services was called and it was recommended that patient should be taken to the ED since the process of power of attorney was started, but was not yet completed and patient is currently not in a position to make decision for herself.Bedbugs was reported by EMS, but no bedbugs noted in the ED. Of note, patient was said to be ambulatory about 3 weeks ago.  ED Course: In the ED, BP was 146/85, Temp 97.67F and other vital signs are normal. Work up in the ED showed Leukocytosis, hypercalcemia, hypoalbuminemia, elevated transaminitis and elevated total bilirubin. Covid 19 test was negative. CT abdomen/pelvis  Showed a large soft tissue mass at the posterior medial right hemithorax (9.2 x 5.9 x 10.0cm) in size causing destruction of right 7th-10th ribs. Chest X-ray showed prominence of right hilum with perihilar haziness suyspected to be due to infiltrate/pneumonia vs  a mass. She was started on IV antibiotics empirically and hospitalist was asked to admit patient.   Review of Systems: This cannot be obtained at this time due to patient's current condition   Past Medical History:  Diagnosis Date   Alzheimer disease ([place])    Cerebellar ataxia ([place])    Hepatitis    Parkinson disease ([place])     Social History:  reports that she has never smoked. She has never used smokeless tobacco. She reports previous alcohol use. She reports previous drug use.   Allergies  Allergen Reactions   Codeine    Morphine And Related     No family history on file.   Prior to Admission medications   Not on File    Physical Exam: BP 129/68    Pulse 87    Temp (!) 97.5 F (36.4 C) (Axillary)    Resp 14    Ht 5\' 2"  (1.575 m)    Wt 30.1 kg    SpO2 98%    BMI 12.12 kg/m    General: 68 y.o. year-old female cachectic, lethargic but physically aroused, failure to thrive, in no acute distress.   HEENT: Dry mucous membrane.  Normocephalic, atraumatic.  PERRL   Neck: Supple, trachea medial  Cardiovascular: Regular rate and rhythm with no rubs or gallops.  No thyromegaly or JVD noted.  No lower extremity edema. 2/4 pulses in all 4 extremities.  Respiratory: Clear to auscultation with no wheezes or rales.   Abdomen: Soft nontender nondistended with normal bowel sounds x4 quadrants.  Muskuloskeletal: No cyanosis, clubbing or edema noted bilaterally  Neuro: CN II-XII intact, sensation intact with decreased reflexes  Skin: No ulcerative lesions noted or rashes  Psychiatry: Judgement and insight appear normal. Mood is appropriate for condition and setting          Labs on Admission:  Basic Metabolic Panel: Recent Labs  Lab 11/13/2018 2014  NA 134*  K 4.0  CL 92*  CO2 21*  GLUCOSE 94  BUN 23  CREATININE 0.62  CALCIUM 12.4*   Liver Function Tests: Recent Labs  Lab 11/20/2018 2014  AST 50*  ALT 6  ALKPHOS 129*  BILITOT 1.6*  PROT 6.8  ALBUMIN 2.8*   No results for input(s): LIPASE, AMYLASE in the last 168 hours. No results for input(s): AMMONIA in the last 168 hours. CBC: Recent Labs  Lab 12/01/2018 2014  WBC 16.2*  NEUTROABS 14.2*  HGB 14.2  HCT 43.4  MCV 105.9*  PLT 194   Cardiac Enzymes: Recent Labs  Lab 11/20/2018 2014  CKTOTAL 49    BNP (last 3 results) No  results for input(s): BNP in the last 8760 hours.  ProBNP (last 3 results) No results for input(s): PROBNP in the last 8760 hours.  CBG: No results for input(s): GLUCAP in the last 168 hours.  Radiological Exams on Admission: Ct Head Wo Contrast  Result Date: 11/23/2018 CLINICAL DATA:  Altered level of consciousness. EXAM: CT HEAD WITHOUT CONTRAST TECHNIQUE: Contiguous axial images were obtained from the base of the skull through the vertex without intravenous contrast. COMPARISON:  None. FINDINGS: Brain: No evidence of acute infarction, hemorrhage, hydrocephalus, extra-axial collection or mass lesion/mass effect. There is atrophy greater than expected for the patient's age. Vascular: No hyperdense vessel or unexpected calcification. Skull: Normal. Negative for fracture or focal lesion. Sinuses/Orbits: No acute finding. Other: None. IMPRESSION: No acute intracranial abnormality. Electronically Signed   By: Constance Holster M.D.   On: 11/04/2018 22:39   Ct Chest Wo Contrast  Result Date: 11/26/2018 CLINICAL DATA:  Unintended weight loss, sacral ulcer, nonlocalizing abdominal pain, shortness of breath possible pneumonia EXAM: CT CHEST, ABDOMEN AND PELVIS WITHOUT CONTRAST TECHNIQUE: Multidetector CT imaging of the chest, abdomen and pelvis was performed following the standard protocol without IV contrast. Sagittal and coronal MPR images reconstructed from axial data set. No oral or IV contrast administered. COMPARISON:  Chest radiograph 11/08/2018 FINDINGS: CT CHEST FINDINGS Cardiovascular: Atherosclerotic calcifications aorta, proximal great vessels, minimally in coronary arteries. Heart normal size. No pericardial effusion. Mediastinum/Nodes: Esophagus unremarkable. Base of cervical region normal appearance. No thoracic adenopathy. Lungs/Pleura: Emphysematous changes and central peribronchial thickening consistent with COPD. Large soft tissue mass identified at the posteromedial RIGHT hemithorax,  9.2 x 5.9 x 10.0 cm in size, invading the posterior chest wall over a long segment as well as invading 4 vertebral segments of the thoracic spine. Mass enters the spinal canal and causes significant narrowing and compression of the thecal sac, clinically consider cord compression. Mild scattered interstitial changes in the lower lobes bilaterally greater on RIGHT, minimally in the anterolateral LEFT upper lobe. Minimal scarring at the anterolateral periphery of the RIGHT middle lobe. No additional infiltrate, pleural effusion or pneumothorax. Musculoskeletal: Bone destruction of the RIGHT seventh eighth ninth and tenth vertebral bodies along the RIGHT side extending across the midline at the seventh eighth and ninth vertebra. Marked compression deformity of the T8 vertebral body. Mild superior endplate compression fracture of the superior endplate of T9. Bone destruction is also seen extending into the posterior elements at T7 and T8 as well as the RIGHT transverse processes of T6, T7, T8, T9. 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 CT ABDOMEN PELVIS FINDINGS Hepatobiliary: Gallbladder mildly distended but otherwise unremarkable. No biliary dilatation. Poorly defined mass at anterolateral liver likely metastatic lesion 4.2 x 2.7 cm image 17. Pancreas: Mildly atrophic pancreas without mass Spleen: Normal appearance Adrenals/Urinary  Tract: Adrenal glands normal appearance. Tiny nonobstructing LEFT renal calculus. Kidneys, ureters, and bladder normal appearance Stomach/Bowel: Markedly increased stool in rectum. Stomach decompressed. Unable to exclude wall thickening of the distal gastric antrum/pyloric region. Remaining bowel loops unremarkable. Vascular/Lymphatic: Atherosclerotic calcifications aorta and extensively and iliac arteries without aneurysm. Significant thrombus in the distal abdominal aorta markedly narrowing the lumen. No adenopathy. Reproductive: Significant fluid distends the  upper endometrial canal with thinning of the myometrium. Atrophic ovaries. Other: No free air free fluid. No hernia. Musculoskeletal: Diffuse osseous demineralization. Compression fractures of inferior L1 and superior L2. Scoliosis and degenerative changes of the lumbar spine. IMPRESSION: Large soft tissue mass at the posteromedial RIGHT hemithorax 9.2 x 5.9 x 10.0 cm in size, invading the posterior chest wall over a long segment, causing destruction of RIGHT seventh through tenth ribs and the RIGHT lateral aspect of the seventh through tenth thoracic vertebra. Extension of tumor into spinal canal causing marked compression and narrowing of the thecal sac, clinically consider cord compression. Suspected metastatic lesion at anterolateral liver 4.2 x 2.7 cm. Markedly increased stool in rectum. Tiny nonobstructing LEFT renal calculus. Significant fluid distends the upper endometrial canal with thinning of the myometrium; this could be assessed by follow-up pelvic sonography, if clinically indicated based on patient condition. Aortic Atherosclerosis (ICD10-I70.0) and Emphysema (ICD10-J43.9). Findings called to Dr. Cinda Quest On 12/03/2018 at 2253 hours. Electronically Signed   By: Lavonia Dana M.D.   On: 12/03/2018 22:57   Ct Abdomen Pelvis W Contrast  Result Date: 11/27/2018 CLINICAL DATA:  Unintended weight loss, sacral ulcer, nonlocalizing abdominal pain, shortness of breath possible pneumonia EXAM: CT CHEST, ABDOMEN AND PELVIS WITHOUT CONTRAST TECHNIQUE: Multidetector CT imaging of the chest, abdomen and pelvis was performed following the standard protocol without IV contrast. Sagittal and coronal MPR images reconstructed from axial data set. No oral or IV contrast administered. COMPARISON:  Chest radiograph 11/29/2018 FINDINGS: CT CHEST FINDINGS Cardiovascular: Atherosclerotic calcifications aorta, proximal great vessels, minimally in coronary arteries. Heart normal size. No pericardial effusion.  Mediastinum/Nodes: Esophagus unremarkable. Base of cervical region normal appearance. No thoracic adenopathy. Lungs/Pleura: Emphysematous changes and central peribronchial thickening consistent with COPD. Large soft tissue mass identified at the posteromedial RIGHT hemithorax, 9.2 x 5.9 x 10.0 cm in size, invading the posterior chest wall over a long segment as well as invading 4 vertebral segments of the thoracic spine. Mass enters the spinal canal and causes significant narrowing and compression of the thecal sac, clinically consider cord compression. Mild scattered interstitial changes in the lower lobes bilaterally greater on RIGHT, minimally in the anterolateral LEFT upper lobe. Minimal scarring at the anterolateral periphery of the RIGHT middle lobe. No additional infiltrate, pleural effusion or pneumothorax. Musculoskeletal: Bone destruction of the RIGHT seventh eighth ninth and tenth vertebral bodies along the RIGHT side extending across the midline at the seventh eighth and ninth vertebra. Marked compression deformity of the T8 vertebral body. Mild superior endplate compression fracture of the superior endplate of T9. Bone destruction is also seen extending into the posterior elements at T7 and T8 as well as the RIGHT transverse processes of T6, T7, T8, T9. 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 CT ABDOMEN PELVIS FINDINGS Hepatobiliary: Gallbladder mildly distended but otherwise unremarkable. No biliary dilatation. Poorly defined mass at anterolateral liver likely metastatic lesion 4.2 x 2.7 cm image 17. Pancreas: Mildly atrophic pancreas without mass Spleen: Normal appearance Adrenals/Urinary Tract: Adrenal glands normal appearance. Tiny nonobstructing LEFT renal calculus. Kidneys, ureters, and bladder normal  appearance Stomach/Bowel: Markedly increased stool in rectum. Stomach decompressed. Unable to exclude wall thickening of the distal gastric antrum/pyloric region. Remaining  bowel loops unremarkable. Vascular/Lymphatic: Atherosclerotic calcifications aorta and extensively and iliac arteries without aneurysm. Significant thrombus in the distal abdominal aorta markedly narrowing the lumen. No adenopathy. Reproductive: Significant fluid distends the upper endometrial canal with thinning of the myometrium. Atrophic ovaries. Other: No free air free fluid. No hernia. Musculoskeletal: Diffuse osseous demineralization. Compression fractures of inferior L1 and superior L2. Scoliosis and degenerative changes of the lumbar spine. IMPRESSION: Large soft tissue mass at the posteromedial RIGHT hemithorax 9.2 x 5.9 x 10.0 cm in size, invading the posterior chest wall over a long segment, causing destruction of RIGHT seventh through tenth ribs and the RIGHT lateral aspect of the seventh through tenth thoracic vertebra. Extension of tumor into spinal canal causing marked compression and narrowing of the thecal sac, clinically consider cord compression. Suspected metastatic lesion at anterolateral liver 4.2 x 2.7 cm. Markedly increased stool in rectum. Tiny nonobstructing LEFT renal calculus. Significant fluid distends the upper endometrial canal with thinning of the myometrium; this could be assessed by follow-up pelvic sonography, if clinically indicated based on patient condition. Aortic Atherosclerosis (ICD10-I70.0) and Emphysema (ICD10-J43.9). Findings called to Dr. Cinda Quest On 11/07/2018 at 2253 hours. Electronically Signed   By: Lavonia Dana M.D.   On: 11/04/2018 22:57   Dg Chest Portable 1 View  Result Date: 11/19/2018 CLINICAL DATA:  Weakness. EXAM: PORTABLE CHEST 1 VIEW COMPARISON:  None. FINDINGS: No pneumothorax. Prominence of the right hilum with perihilar haziness is identified. The heart, left hilum, and mediastinum are normal. No other acute abnormalities are identified. IMPRESSION: Prominence of the right hilum with right perihilar haziness. This could represent infiltrate/pneumonia  versus a mass. Recommend a CT scan for further evaluation. Electronically Signed   By: Dorise Bullion III M.D   On: 11/14/2018 21:04    EKG: I independently viewed the EKG done and my findings are as followed: EKG was not done   Assessment/Plan Present on Admission:  Altered mental status  Principal Problem:   Altered mental status Active Problems:   Hypercalcemia   Dehydration   Mass in chest   Failure to thrive (0-17)   Hypoalbuminemia   Chronic hepatitis C without hepatic coma (HCC)   Transaminitis   Community acquired pneumonia   Parkinson disease ([place])  Altered mental status possibly secondary to multifactorial Patient presents to the emergency department due to altered mental status, Per report, patient was on hospice and has desire to die at home, however, paperwork on power of attorney was not completed an adult protective services recommended for patient was brought to the ED for further evaluation and management. Of note, patient was ambulatory as of 3 weeks ago, patient is currently bedbound, she was lethargic, though easily aroused, but was unable to participate in being interviewed. Continue fall precaution, aspiration precaution, seizure precaution and neuro checks. Swallow eval by nursing prior to diet.  Large soft mass at posteromedial right hemithorax CT abdomen/pelvis  Showed a large soft tissue mass at the posterior medial right hemithorax (9.2 x 5.9 x 10.0cm) in size causing destruction of right 7th-10th ribs.  Tumoral extension into spinal canal causing marked compression and narrowing of the thecal sac considered to be cord compression noted.  Suspected metastatic lesion to the liver 4.2 x 2.7 cm noted  Patient has lost 40Ibs since March per medical record Continue PT/OT eval and treat Consider oncology consult  Questionable pneumonia  X-ray showed prominence of right hilum with perihilar haziness suyspected to be due to infiltrate/pneumonia vs  a mass.    Patient presented with leukocytosis with WBC of 16.2, however image was also suspected to be possibly due to a mass.  She was empirically started on IV cefepime.  We shall continue to empirically treat with IV Unasyn with plan to de-escalate based on procalcitonin and blood culture Continue incentive spirometry and flutter valve when able to cooperate  Hypercalcemia possibly secondary to above Continue IV hydration  Dehydration Patient was noted to have an anion gap of 21 , this may be due to dehydration. Continue IV hydration and repeat BMP in the morning  Failure to thrive/Hypoalbuminemia possibly secondary to severe protein calorie malnutrition in the setting of right hemithorax and possible metastatic lesion to the level  Albumin 2.8 Dietary will be consulted  Elevated transaminitis/elevated total protein in the presence of history of Hepatitis C and suspected metastatic lesions to liver. AST 50, ALP 129, total bilirubin 1.6 Continue to monitor liver panel  Sacral decubitus Continue wound care by nursing  History of Parkinson's disease Patient has no med on med rec for Parkinson's. We shall await updated med rec   DVT prophylaxis: Lovenox, SCDs  Code Status: DNR  Family Communication: None at bedside  Disposition Plan: Hospice Home once stable  Consults called: None  Admission status: Inpatient   Bernadette Hoit MD [facility]  If 7PM-7AM, please contact night-coverage [email or web address]  12/05/2018, 3:51 AM

## 2018-12-05 NOTE — H&P (Signed)
  History and Physical  Sandra Maddox [id number] DOB: 06-26-1950 DOA: 11/20/2018  Referring physician: Dr. Cinda Quest Patient coming from: Home Chief Complaint:   HPI: Sandra Maddox is a 68 y.o. female with medical history significant for crohn's disease, Parkinson's disease, hepatitis C and Tobacco abiuse   The H & P has already been done. This is a duplicated copy.

## 2018-12-05 NOTE — Progress Notes (Addendum)
PHARMACY - PHYSICIAN COMMUNICATION CRITICAL VALUE ALERT - BLOOD CULTURE IDENTIFICATION (BCID)  Sandra Maddox is an 68 y.o. female who presented to [facility] on 11/20/2018 with a chief complaint of pneumonia.  Assessment:  3/4 bottles from(2 aerobic/ 1 anaerobic bottle) positive gram positive cocci. 1 aerobic positive identified as Staph Species, mecA not detected.  Name of physician (or Provider) Contacted: Dr. Lorella Nimrod  Current antibiotics: Unasyn 1.5 g Q6 hours  Changes to prescribed antibiotics recommended:  Provider was contacted with results.  No changes recommended at this time.  No results found for this or any previous visit.  Pearla Dubonnet, PharmD Clinical Pharmacist 12/05/2018 7:12 PM

## 2018-12-05 NOTE — ED Notes (Signed)
RN woke up pt to offer breakfast tray, pt speech is not clear, pt resting on left side pt did say "no" when offered something to eat.

## 2018-12-05 DEATH — deceased

## 2018-12-06 DIAGNOSIS — E43 Unspecified severe protein-calorie malnutrition: Secondary | ICD-10-CM | POA: Insufficient documentation

## 2018-12-06 DIAGNOSIS — L8990 Pressure ulcer of unspecified site, unspecified stage: Secondary | ICD-10-CM | POA: Insufficient documentation

## 2018-12-06 LAB — COMPREHENSIVE METABOLIC PANEL
ALT: 13 U/L (ref 0–44)
AST: 49 U/L — ABNORMAL HIGH (ref 15–41)
Albumin: 2.2 g/dL — ABNORMAL LOW (ref 3.5–5.0)
Alkaline Phosphatase: 90 U/L (ref 38–126)
Anion gap: 18 — ABNORMAL HIGH (ref 5–15)
BUN: 14 mg/dL (ref 8–23)
CO2: 18 mmol/L — ABNORMAL LOW (ref 22–32)
Calcium: 10.7 mg/dL — ABNORMAL HIGH (ref 8.9–10.3)
Chloride: 108 mmol/L (ref 98–111)
Creatinine, Ser: 0.37 mg/dL — ABNORMAL LOW (ref 0.44–1.00)
GFR calc Af Amer: >60 mL/min (ref >60)
GFR calc non Af Amer: >60 mL/min (ref >60)
Glucose, Bld: 69 mg/dL — ABNORMAL LOW (ref 70–99)
Potassium: 2.4 mmol/L — CL (ref 3.5–5.1)
Sodium: 144 mmol/L (ref 135–145)
Total Bilirubin: 1.4 mg/dL — ABNORMAL HIGH (ref 0.3–1.2)
Total Protein: 5.5 g/dL — ABNORMAL LOW (ref 6.5–8.1)

## 2018-12-06 LAB — CBC
HCT: 35.3 % — ABNORMAL LOW (ref 36.0–46.0)
Hemoglobin: 11.6 g/dL — ABNORMAL LOW (ref 12.0–15.0)
MCH: 34.7 pg — ABNORMAL HIGH (ref 26.0–34.0)
MCHC: 32.9 g/dL (ref 30.0–36.0)
MCV: 105.7 fL — ABNORMAL HIGH (ref 80.0–100.0)
Platelets: 153 10*3/uL (ref 150–400)
RBC: 3.34 MIL/uL — ABNORMAL LOW (ref 3.87–5.11)
RDW: 16.2 % — ABNORMAL HIGH (ref 11.5–15.5)
WBC: 15.3 10*3/uL — ABNORMAL HIGH (ref 4.0–10.5)
nRBC: 0 % (ref 0.0–0.2)

## 2018-12-06 LAB — MAGNESIUM: Magnesium: 1.5 mg/dL — ABNORMAL LOW (ref 1.7–2.4)

## 2018-12-06 MED ORDER — POTASSIUM CHLORIDE 10 MEQ/100ML IV SOLN
10.0000 meq | INTRAVENOUS | Status: AC
  Administered 2018-12-06 (×6): 10 meq via INTRAVENOUS
  Filled 2018-12-06 (×2): qty 100

## 2018-12-06 MED ORDER — MAGNESIUM SULFATE 2 GM/50ML IV SOLN
2.0000 g | Freq: Once | INTRAVENOUS | Status: AC
  Administered 2018-12-06: 2 g via INTRAVENOUS
  Filled 2018-12-06: qty 50

## 2018-12-06 MED ORDER — COLLAGENASE 250 UNIT/GM EX OINT
TOPICAL_OINTMENT | Freq: Every day | CUTANEOUS | Status: DC
  Administered 2018-12-06 – 2018-12-11 (×6): via TOPICAL
  Filled 2018-12-06: qty 30

## 2018-12-06 NOTE — Evaluation (Signed)
Clinical/Bedside Swallow Evaluation Patient Details  Name: Sandra Maddox MRN: [id number] Date of Birth: 12-12-1950  Today's Date: 12/06/2018 Time: SLP Start Time (ACUTE ONLY): 0840 SLP Stop Time (ACUTE ONLY): 0930 SLP Time Calculation (min) (ACUTE ONLY): 50 min  Past Medical History:  Past Medical History:  Diagnosis Date  . Alzheimer disease ([place])   . Cerebellar ataxia ([place])   . Hepatitis   . Parkinson disease ([place])    HPI:  Pt is a 68 y.o. female with medical history significant for a Large soft tissue mass at the posteromedial RIGHT hemithorax 9.2 x 5.9 x 10.0 cm in size, invading the posterior chest wall w/ Extension of tumor into spinal canal and suspected into the Liver, Crohn's dis., Alzheimer's Dementia, Cerebellar ataxia, Parkinson's disease and Hepatitis C who presents to the ED via EMS from home due to altered mental status. History cannot be obtained from patient due to altered mental status, history was obtained from ED physician, nurse and ED Chart. Per report, Patient lives alone, but she has a friend(Anita) that has been taking care of her, patient was said to be on Hospice and receiving [facility] services, and that she will prefer to die at home. Adult protective services was called d/t pt's situation, and it was recommended that patient should be taken to the ED since the process of power of attorney was started, but was not yet completed and patient is currently not in a position to make decision for herself. Bedbugs was reported by EMS, but no bedbugs noted in the ED. Work up in the ED showed Leukocytosis, hypercalcemia, hypoalbuminemia, elevated transaminitis and elevated total bilirubin. Covid 19 test was negative. CT abdomen/pelvis  Showed a large soft tissue mass at the posterior medial right hemithorax (9.2 x 5.9 x 10.0cm) in size causing destruction of right 7th-10th ribs. Chest X-ray showed prominence of right hilum with perihilar haziness suspected to be infiltrate vs Mass.  Pt required total assist and encouragement to participate in this BSE today; she did not exhibit awareness of situation or task given MAX verbal/tactile cues.    Assessment / Plan / Recommendation Clinical Impression  Pt appears to present w/ Severe oropharyngeal phase Dysphagia impacted by her Severely declined Cognitive status and mental attention/awareness to task of oral intake, swallowing. Noted pt's current Cachectic presentation and BMI of 13.10. 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 Pt was in a fetal position on her Left side requiring much support for more upright positioning for po trials. She required MAX verbal/tactile stim/cues to engage in task of ice chip trials. Noted reduced awareness to verbal presentation of ice chips; spoon to lips. SLP then gave tactile stim of ice chips on lips to enduce mouth opening and awareness of po task. When pt opened mouth slightly more during mumbled speech, the ice chips were placed anteriorly in her mouth. Noted reduced lingual/oral movements to attend to chips; manipulate chips for swallowing. No pharyngeal swallows were appreciated but it was difficult to palpate the pharynx d/t pt's positioning. Pt did not masticate ice chips -- suspect d/t severely poor Dentition/gum status.  NO further trials were attempted d/t pt's current presentation and increased risk for aspiration at this time. Recommend continue strict NPO status also in light of pt's decreased awareness of oral intake/task which could increase risk for choking as well. Recommend frequent oral care for hygiene and stimulation of swallowing w/ aspiration precautions.  ST services will f/u daily w/ ongoing assessment of pt's swallow function and safety w/ least restrictive diet hopefully. Noted pt has a Palliative Care consult -- recommend discussion for GOC.  SLP Visit Diagnosis: Dysphagia, oropharyngeal phase  (R13.12)(mental status decline)    Aspiration Risk  Moderate aspiration risk;Risk for inadequate nutrition/hydration    Diet Recommendation  NPO w/ frequent oral care and strict aspiration precautions w/ such  Medication Administration: Via alternative means    Other  Recommendations Recommended Consults: (Palliative care; Dietician) Oral Care Recommendations: Oral care QID;Staff/trained caregiver to provide oral care Other Recommendations: (TBD)   Follow up Recommendations Skilled Nursing facility(TBD)      Frequency and Duration min 3x week  2 weeks       Prognosis Prognosis for Safe Diet Advancement: Guarded Barriers to Reach Goals: Cognitive deficits;Time post onset;Behavior;Severity of deficits;Language deficits      Swallow Study   General Date of Onset: 12/03/2018 HPI: Pt is a 68 y.o. female with medical history significant for a Large soft tissue mass at the posteromedial RIGHT hemithorax 9.2 x 5.9 x 10.0 cm in size, invading the posterior chest wall w/ Extension of tumor into spinal canal and suspected into the Liver, Crohn's dis., Alzheimer's Dementia, Cerebellar ataxia, Parkinson's disease and Hepatitis C who presents to the ED via EMS from home due to altered mental status. History cannot be obtained from patient due to altered mental status, history was obtained from ED physician, nurse and ED Chart. Per report, Patient lives alone, but she has a friend(Anita) that has been taking care of her, patient was said to be on Hospice and receiving [facility] services, and that she will prefer to die at home. Adult protective services was called d/t pt's situation, and it was recommended that patient should be taken to the ED since the process of power of attorney was started, but was not yet completed and patient is currently not in a position to make decision for herself. Bedbugs was reported by EMS, but no bedbugs noted in the ED. Work up in the ED showed Leukocytosis, hypercalcemia,  hypoalbuminemia, elevated transaminitis and elevated total bilirubin. Covid 19 test was negative. CT abdomen/pelvis  Showed a large soft tissue mass at the posterior medial right hemithorax (9.2 x 5.9 x 10.0cm) in size causing destruction of right 7th-10th ribs. Chest X-ray showed prominence of right hilum with perihilar haziness suspected to be infiltrate vs Mass. Pt required total assist and encouragement to participate in this BSE today; she did not exhibit awareness of situation or task given MAX verbal/tactile cues.  Type of Study: Bedside Swallow Evaluation Previous Swallow Assessment: none reported Diet Prior to this Study: NPO Temperature Spikes Noted: No(wbc 15.3 down from 16.2) Respiratory Status: Room air History of Recent Intubation: No Behavior/Cognition: Confused;Distractible;Requires cueing;Doesn't follow directions(mumbled speech) Oral Cavity Assessment: Dry(difficult to assess d/t mental status decline) Oral Care Completed by SLP: (attempted but pt would not open mouth fully) Oral Cavity - Dentition: Missing dentition;Poor condition(blackened teeth and gums) Vision: (n/a) Self-Feeding Abilities: Total assist Patient Positioning: Postural control adequate for testing(somewhat on her Left side in fetal position, upright) Baseline Vocal Quality: Low vocal intensity(mumbled speech) Volitional Cough: Cognitively unable to elicit Volitional Swallow: Unable to elicit    Oral/Motor/Sensory Function Overall Oral Motor/Sensory Function: Generalized oral weakness(could not assess fully d/t pt's mental status decline) Facial Symmetry: Within Functional Limits   Ice Chips Ice chips: Impaired Presentation: Spoon(fed; 5 trials) Oral Phase Impairments: Poor awareness of bolus;Reduced lingual movement/coordination;Reduced labial  seal(Poor control) Oral Phase Functional Implications: Prolonged oral transit(reduced lingual/oral movements to attend to chips) Pharyngeal Phase Impairments: Unable  to trigger swallow(no overt coughing occurred) Other Comments: did not appreciate a pharyngeal swallow   Thin Liquid Thin Liquid: Not tested    Nectar Thick Nectar Thick Liquid: Not tested   Honey Thick Honey Thick Liquid: Not tested   Puree Puree: Not tested   Solid     Solid: Not tested        Orinda Kenner, MS, CCC-SLP Watson,Katherine 12/06/2018,3:44 PM

## 2018-12-06 NOTE — Consult Note (Signed)
[place] Nurse wound consult note Patient receiving care in [facility]. Reason for Consult: "pressure ulcer" Wound types: Sacral/coccyx wound is both a DTPI and unstageable PI.  It measures 7.4 cm x 2 cm, is purple/evolving to black with scattered yellow and pink. The right trochanter is a DTPI that measures 4 cm x 2 cm and is purple. The left trochanter is a DTPI with a small unstageable area.  The entire area of purple discoloration measures 11 cm x 8 cm.  There are multiple scattered purple discoloration along the right posterior rib cage. Pressure Injury POA: Yes  Dressing procedure/placement/frequency: Apply Santyl to the sacral/coccyx wound in a nickel thick layer. Cover with a saline moistened gauze, then dry gauze or ABD pad.  Change daily. Foam dressings to the bilateral trochanter wounds, and any other discolored areas. Also, I have ordered and air mattress and to use ONLY ONE DermaTherapy pad beneath patient's buttocks. Do NOT use disposable pads.  Monitor the wound area(s) for worsening of condition such as: Signs/symptoms of infection,  Increase in size,  Development of or worsening of odor, Development of pain, or increased pain at the affected locations.  Notify the medical team if any of these develop.  Thank you for the consult.  Discussed plan of care with the bedside nurse.  [place] nurse will not follow at this time.  Please re-consult the [place] team if needed.  Val Riles, RN, MSN, CWOCN, CNS-BC, pager [phone]

## 2018-12-06 NOTE — Progress Notes (Signed)
Initial Nutrition Assessment  DOCUMENTATION CODES:   Severe malnutrition in context of chronic illness, Underweight  INTERVENTION:  Will monitor for outcome of discussions regarding goals of care and plan of care.  Monitor magnesium, potassium, and phosphorus daily for at least 3 days, MD to replete as needed, as pt is at risk for refeeding syndrome given severe malnutrition.  NUTRITION DIAGNOSIS:   Severe Malnutrition related to chronic illness(Parkinson's disease, Alzheimer's disease) as evidenced by severe fat depletion, severe muscle depletion.  GOAL:   Patient will meet greater than or equal to 90% of their needs  MONITOR:   Diet advancement, Labs, Weight trends, Skin, I & O's  REASON FOR ASSESSMENT:   Malnutrition Screening Tool, Consult Assessment of nutrition requirement/status  ASSESSMENT:   68 year old female with PMHx of Alzheimer's disease, Parkinson's disease, hepatitis admitted with encephalopathy, FTT, staph Capitis bacteremia, pressure ulcers.   -Following SLP evaluation today it was recommend patient be NPO.  Patient sleeping on her side in room at time of RD assessment. Unable to provide any history. Pending PMT consult for goals of care. Patient was followed by hospice at home per chart. Will monitor for outcome of discussions regarding goals of care and for plan of care.  No weight history in chart to trend. Patient is currently 32.5 kg (71.65 lbs).  Medications reviewed and include: NS at 75 mL/hr, Unasyn.  Labs reviewed: Potassium 2.4, CO2 18, Creatinine 0.37, Magnesium 1.5.  NUTRITION - FOCUSED PHYSICAL EXAM:    Most Recent Value  Orbital Region  Severe depletion  Upper Arm Region  Severe depletion  Thoracic and Lumbar Region  Severe depletion  Buccal Region  Severe depletion  Temple Region  Severe depletion  Clavicle Bone Region  Severe depletion  Clavicle and Acromion Bone Region  Severe depletion  Scapular Bone Region  Severe depletion   Dorsal Hand  Severe depletion  Patellar Region  Severe depletion  Anterior Thigh Region  Severe depletion  Posterior Calf Region  Severe depletion  Edema (RD Assessment)  -- [non pitting]  Hair  Reviewed  Eyes  Unable to assess  Mouth  Unable to assess  Skin  Reviewed  Nails  Reviewed     Diet Order:   Diet Order            Diet NPO time specified  Diet effective now             EDUCATION NEEDS:   No education needs have been identified at this time  Skin:  Skin Assessment: Skin Integrity Issues:(unstageable sacrum; stg IIs to bilateral heels and left buttocks)  Last BM:  12/06/2018 - large type 2  Height:   Ht Readings from Last 1 Encounters:  11/29/2018 5\' 2"  (1.575 m)   Weight:   Wt Readings from Last 1 Encounters:  12/05/18 32.5 kg   Ideal Body Weight:  50 kg  BMI:  Body mass index is 13.1 kg/m.  Estimated Nutritional Needs:   Kcal:  1200-1400  Protein:  55-65 grams  Fluid:  1.2-1.4 L/day  Jacklynn Barnacle, MS, RD, LDN Office: [phone] Pager: [phone] After Hours/Weekend Pager: [phone]

## 2018-12-06 NOTE — Progress Notes (Addendum)
PROGRESS NOTE    Sandra Maddox  [id number] DOB: 1950-02-08 DOA: 11/22/2018 PCP: Patient, No Pcp Per  Outpatient Specialists:  Brief Narrative:68 yo hx end stage parkinsons, chron's, hepatitis c, ?dementia admitted acute encephalopathy.  Most likely having a extensive metastatic disease.  Talked with a friend [name] who periodically checked on her.  According to her patient is under hospice care and wants to die at home.  Patient lives alone and cannot take care of herself.  No power of attorney.  Palliative care was consulted to figure out goals of care and a possible in patient hospice placement.  Assessment & Plan:   Principal Problem:   Altered mental status Active Problems:   Hypercalcemia   Dehydration   Mass in chest   Failure to thrive (0-17)   Hypoalbuminemia   Chronic hepatitis C without hepatic coma (HCC)   Transaminitis   Community acquired pneumonia   Parkinson disease ([place])   Pressure injury of skin  Encephalopathy with failure to thrive.  Multifactorial most likely secondary to extensive metastatic disease of unknown primary, sepsis and she cannot take care of herself.  I do not think that she is a good candidate for more extensive workout to find her primary or to get cancer treatment. Patient also failed swallow evaluation and n.p.o. was recommended. -Palliative consulted to help [place] with her goals of care.   Hypokalemia and hypomagnesemia.  Replete electrolytes and monitor.  Staph capitis bacteremia.  3/4 blood culture bottles are growing staph Capitis. Waiting for sensitivity results. -Continue Unasyn for now.  Decubitus pressure ulcers.  She has multiple pressure ulcers. -Wound care was consulted and we appreciate their recommendations -Air mattress was ordered.  DVT prophylaxis: Lovenox Code Status: DNR Family Communication:  Disposition Plan: Most likely inpatient hospice-pending palliative care consult.  Consultants:    Palliative  Antimicrobials:  Unasyn-day 2  Subjective: Patient appears little more alert as compared to yesterday.  Continue to say help nodded yes when asked about pain but unable to specify where she is hurting.  Objective: Vitals:   12/05/18 2044 12/05/18 2115 12/06/18 0500 12/06/18 0900  BP: (!) 145/85 115/88 131/79 115/79  Pulse: 65 94 92 94  Resp: 17 18 16 18   Temp: 98.3 F (36.8 C) 97.8 F (36.6 C) (!) 97.4 F (36.3 C) 98.5 F (36.9 C)  TempSrc: Rectal Oral Axillary Axillary  SpO2: 96% 97% 95% 98%  Weight: 32.5 kg     Height:        Intake/Output Summary (Last 24 hours) at 12/06/2018 1416 Last data filed at 12/06/2018 0507 Gross per 24 hour  Intake 2607.98 ml  Output 1 ml  Net 2606.98 ml   Filed Weights   12/03/2018 2114 12/05/18 2044  Weight: 30.1 kg 32.5 kg    Examination:  General exam: Appears calm and very cachectic.  A little more alert than yesterday.  Still lying in fetal position. Respiratory system: Decreased breath sounds on right hemithorax with some scattered crackles on the left.  Respiratory effort normal. Cardiovascular system: S1 & S2 heard, RRR. No JVD, murmurs, rubs, gallops or clicks. No pedal edema. Gastrointestinal system: Abdomen is nondistended, soft and nontender.  Normal bowel sounds heard. Central nervous system: Unable to assess as patient was not oriented. Psychiatry: Patient was not oriented.  Data Reviewed: I have personally reviewed following labs and imaging studies  CBC: Recent Labs  Lab 11/09/2018 2014 12/05/18 1032 12/06/18 0454  WBC 16.2* 15.3* 15.3*  NEUTROABS 14.2*  --   --  HGB 14.2 13.6 11.6*  HCT 43.4 42.3 35.3*  MCV 105.9* 106.8* 105.7*  PLT 194 156 0000000   Basic Metabolic Panel: Recent Labs  Lab 11/19/2018 2014 12/05/18 1032 12/05/18 1618 12/06/18 0454  NA 134* 138 138 144  K 4.0 3.5 3.7 2.4*  CL 92* 98 104 108  CO2 21* 18* 17* 18*  GLUCOSE 94 56* 61* 69*  BUN 23 21 19 14   CREATININE 0.62 0.46 0.46  0.37*  CALCIUM 12.4* 11.8* 11.2* 10.7*  MG  --   --   --  1.5*   GFR: Estimated Creatinine Clearance: 35 mL/min (A) (by C-G formula based on SCr of 0.37 mg/dL (L)). Liver Function Tests: Recent Labs  Lab 11/25/2018 2014 12/05/18 1032 12/06/18 0454  AST 50* 48* 49*  ALT 6 8 13   ALKPHOS 129* 115 90  BILITOT 1.6* 1.6* 1.4*  PROT 6.8 6.6 5.5*  ALBUMIN 2.8* 2.7* 2.2*   No results for input(s): LIPASE, AMYLASE in the last 168 hours. No results for input(s): AMMONIA in the last 168 hours. Coagulation Profile: No results for input(s): INR, PROTIME in the last 168 hours. Cardiac Enzymes: Recent Labs  Lab 11/30/2018 2014  CKTOTAL 49   BNP (last 3 results) No results for input(s): PROBNP in the last 8760 hours. HbA1C: No results for input(s): HGBA1C in the last 72 hours. CBG: No results for input(s): GLUCAP in the last 168 hours. Lipid Profile: No results for input(s): CHOL, HDL, LDLCALC, TRIG, CHOLHDL, LDLDIRECT in the last 72 hours. Thyroid Function Tests: No results for input(s): TSH, T4TOTAL, FREET4, T3FREE, THYROIDAB in the last 72 hours. Anemia Panel: No results for input(s): VITAMINB12, FOLATE, FERRITIN, TIBC, IRON, RETICCTPCT in the last 72 hours. Urine analysis: No results found for: COLORURINE, APPEARANCEUR, LABSPEC, PHURINE, GLUCOSEU, HGBUR, BILIRUBINUR, KETONESUR, PROTEINUR, UROBILINOGEN, NITRITE, LEUKOCYTESUR Sepsis Labs: @LABRCNTIP (procalcitonin:4,lacticidven:4)  ) Recent Results (from the past 240 hour(s))  SARS Coronavirus 2 by RT PCR (hospital order, performed in [facility] hospital lab) Nasopharyngeal Nasopharyngeal Swab     Status: None   Collection Time: 11/28/2018  8:20 PM   Specimen: Nasopharyngeal Swab  Result Value Ref Range Status   SARS Coronavirus 2 NEGATIVE NEGATIVE Final    Comment: (NOTE) If result is NEGATIVE SARS-CoV-2 target nucleic acids are NOT DETECTED. The SARS-CoV-2 RNA is generally detectable in upper and lower  respiratory specimens  during the acute phase of infection. The lowest  concentration of SARS-CoV-2 viral copies this assay can detect is 250  copies / mL. A negative result does not preclude SARS-CoV-2 infection  and should not be used as the sole basis for treatment or other  patient management decisions.  A negative result may occur with  improper specimen collection / handling, submission of specimen other  than nasopharyngeal swab, presence of viral mutation(s) within the  areas targeted by this assay, and inadequate number of viral copies  (<250 copies / mL). A negative result must be combined with clinical  observations, patient history, and epidemiological information. If result is POSITIVE SARS-CoV-2 target nucleic acids are DETECTED. The SARS-CoV-2 RNA is generally detectable in upper and lower  respiratory specimens dur ing the acute phase of infection.  Positive  results are indicative of active infection with SARS-CoV-2.  Clinical  correlation with patient history and other diagnostic information is  necessary to determine patient infection status.  Positive results do  not rule out bacterial infection or co-infection with other viruses. If result is PRESUMPTIVE POSTIVE SARS-CoV-2 nucleic acids MAY BE  PRESENT.   A presumptive positive result was obtained on the submitted specimen  and confirmed on repeat testing.  While 2019 novel coronavirus  (SARS-CoV-2) nucleic acids may be present in the submitted sample  additional confirmatory testing may be necessary for epidemiological  and / or clinical management purposes  to differentiate between  SARS-CoV-2 and other Sarbecovirus currently known to infect humans.  If clinically indicated additional testing with an alternate test  methodology [phone]) is advised. The SARS-CoV-2 RNA is generally  detectable in upper and lower respiratory sp ecimens during the acute  phase of infection. The expected result is Negative. Fact Sheet for Patients:   StrictlyIdeas.no Fact Sheet for Healthcare Providers: BankingDealers.co.za This test is not yet approved or cleared by the [place] FDA and has been authorized for detection and/or diagnosis of SARS-CoV-2 by FDA under an Emergency Use Authorization (EUA).  This EUA will remain in effect (meaning this test can be used) for the duration of the COVID-19 declaration under Section 564(b)(1) of the Act, 21 U.S.C. section 360bbb-3(b)(1), unless the authorization is terminated or revoked sooner. Performed at [facility], [address] [place], [place] [address]   Culture, blood (routine x 2)     Status: None (Preliminary result)   Collection Time: 11/18/2018 11:08 PM   Specimen: BLOOD  Result Value Ref Range Status   Specimen Description   Final    BLOOD LEFT ANTECUBITAL Performed at [facility], [address], [place], [place] [address]    Special Requests   Final    BOTTLES DRAWN AEROBIC AND ANAEROBIC Blood Culture adequate volume Performed at [facility], [address], [place], [place] [address]    Culture  Setup Time   Final    GRAM POSITIVE COCCI IN BOTH AEROBIC AND ANAEROBIC BOTTLES CRITICAL RESULT CALLED TO, READ BACK BY AND VERIFIED WITH: CHRIS [facility] AT [id number] 12/05/2018.PMF Performed at [facility], [address], [place], [place] [address]    Culture   Final    Lonell Grandchild POSITIVE COCCI CULTURE REINCUBATED FOR BETTER GROWTH Performed at [facility], [address], [place], [place] [address]    Report Status PENDING  Incomplete  Culture, blood (routine x 2)     Status: None (Preliminary result)   Collection Time: 11/13/2018 11:10 PM   Specimen: BLOOD  Result Value Ref Range Status   Specimen Description BLOOD RIGHT ANTECUBITAL  Final   Special Requests   Final    BOTTLES DRAWN AEROBIC AND ANAEROBIC Blood Culture adequate volume   Culture  Setup Time   Final    Organism ID to  follow GRAM POSITIVE COCCI AEROBIC BOTTLE ONLY CRITICAL RESULT CALLED TO, READ BACK BY AND VERIFIED WITH: Rito Ehrlich AT [id number] 12/05/2018.PMF Performed at [facility], [address] [place], [place] [address]    Culture [facility] POSITIVE COCCI  Final   Report Status PENDING  Incomplete  Blood Culture ID Panel (Reflexed)     Status: Abnormal   Collection Time: 11/15/2018 11:10 PM  Result Value Ref Range Status   Enterococcus species NOT DETECTED NOT DETECTED Final   Listeria monocytogenes NOT DETECTED NOT DETECTED Final   Staphylococcus species DETECTED (A) NOT DETECTED Final    Comment: Methicillin (oxacillin) susceptible coagulase negative staphylococcus. Possible blood culture contaminant (unless isolated from more than one blood culture draw or clinical case suggests pathogenicity). No antibiotic treatment is indicated for blood  culture contaminants. CRITICAL RESULT CALLED TO, READ BACK BY AND  VERIFIED WITH: Rito Ehrlich AT 1730 12/05/2018.PMF    Staphylococcus aureus (BCID) NOT DETECTED NOT DETECTED Final   Methicillin resistance NOT DETECTED NOT DETECTED Final   Streptococcus species NOT DETECTED NOT DETECTED Final   Streptococcus agalactiae NOT DETECTED NOT DETECTED Final   Streptococcus pneumoniae NOT DETECTED NOT DETECTED Final   Streptococcus pyogenes NOT DETECTED NOT DETECTED Final   Acinetobacter baumannii NOT DETECTED NOT DETECTED Final   Enterobacteriaceae species NOT DETECTED NOT DETECTED Final   Enterobacter cloacae complex NOT DETECTED NOT DETECTED Final   Escherichia coli NOT DETECTED NOT DETECTED Final   Klebsiella oxytoca NOT DETECTED NOT DETECTED Final   Klebsiella pneumoniae NOT DETECTED NOT DETECTED Final   Proteus species NOT DETECTED NOT DETECTED Final   Serratia marcescens NOT DETECTED NOT DETECTED Final   Haemophilus influenzae NOT DETECTED NOT DETECTED Final   Neisseria meningitidis NOT DETECTED NOT DETECTED Final   Pseudomonas aeruginosa NOT  DETECTED NOT DETECTED Final   Candida albicans NOT DETECTED NOT DETECTED Final   Candida glabrata NOT DETECTED NOT DETECTED Final   Candida krusei NOT DETECTED NOT DETECTED Final   Candida parapsilosis NOT DETECTED NOT DETECTED Final   Candida tropicalis NOT DETECTED NOT DETECTED Final    Comment: Performed at [facility], [address] [place], [place] [address]     Radiology Studies: Ct Head Wo Contrast  Result Date: 11/04/2018 CLINICAL DATA:  Altered level of consciousness. EXAM: CT HEAD WITHOUT CONTRAST TECHNIQUE: Contiguous axial images were obtained from the base of the skull through the vertex without intravenous contrast. COMPARISON:  None. FINDINGS: Brain: No evidence of acute infarction, hemorrhage, hydrocephalus, extra-axial collection or mass lesion/mass effect. There is atrophy greater than expected for the patient's age. Vascular: No hyperdense vessel or unexpected calcification. Skull: Normal. Negative for fracture or focal lesion. Sinuses/Orbits: No acute finding. Other: None. IMPRESSION: No acute intracranial abnormality. Electronically Signed   By: Constance Holster M.D.   On: 12/03/2018 22:39   Ct Chest Wo Contrast  Result Date: 12/02/2018 CLINICAL DATA:  Unintended weight loss, sacral ulcer, nonlocalizing abdominal pain, shortness of breath possible pneumonia EXAM: CT CHEST, ABDOMEN AND PELVIS WITHOUT CONTRAST TECHNIQUE: Multidetector CT imaging of the chest, abdomen and pelvis was performed following the standard protocol without IV contrast. Sagittal and coronal MPR images reconstructed from axial data set. No oral or IV contrast administered. COMPARISON:  Chest radiograph 12/03/2018 FINDINGS: CT CHEST FINDINGS Cardiovascular: Atherosclerotic calcifications aorta, proximal great vessels, minimally in coronary arteries. Heart normal size. No pericardial effusion. Mediastinum/Nodes: Esophagus unremarkable. Base of cervical region normal appearance. No thoracic  adenopathy. Lungs/Pleura: Emphysematous changes and central peribronchial thickening consistent with COPD. Large soft tissue mass identified at the posteromedial RIGHT hemithorax, 9.2 x 5.9 x 10.0 cm in size, invading the posterior chest wall over a long segment as well as invading 4 vertebral segments of the thoracic spine. Mass enters the spinal canal and causes significant narrowing and compression of the thecal sac, clinically consider cord compression. Mild scattered interstitial changes in the lower lobes bilaterally greater on RIGHT, minimally in the anterolateral LEFT upper lobe. Minimal scarring at the anterolateral periphery of the RIGHT middle lobe. No additional infiltrate, pleural effusion or pneumothorax. Musculoskeletal: Bone destruction of the RIGHT seventh eighth ninth and tenth vertebral bodies along the RIGHT side extending across the midline at the seventh eighth and ninth vertebra. Marked compression deformity of the T8 vertebral body. Mild superior endplate compression fracture of the superior endplate of T9. Bone destruction is also  seen extending into the posterior elements at T7 and T8 as well as the RIGHT transverse processes of T6, T7, T8, T9. 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 CT ABDOMEN PELVIS FINDINGS Hepatobiliary: Gallbladder mildly distended but otherwise unremarkable. No biliary dilatation. Poorly defined mass at anterolateral liver likely metastatic lesion 4.2 x 2.7 cm image 17. Pancreas: Mildly atrophic pancreas without mass Spleen: Normal appearance Adrenals/Urinary Tract: Adrenal glands normal appearance. Tiny nonobstructing LEFT renal calculus. Kidneys, ureters, and bladder normal appearance Stomach/Bowel: Markedly increased stool in rectum. Stomach decompressed. Unable to exclude wall thickening of the distal gastric antrum/pyloric region. Remaining bowel loops unremarkable. Vascular/Lymphatic: Atherosclerotic calcifications aorta and  extensively and iliac arteries without aneurysm. Significant thrombus in the distal abdominal aorta markedly narrowing the lumen. No adenopathy. Reproductive: Significant fluid distends the upper endometrial canal with thinning of the myometrium. Atrophic ovaries. Other: No free air free fluid. No hernia. Musculoskeletal: Diffuse osseous demineralization. Compression fractures of inferior L1 and superior L2. Scoliosis and degenerative changes of the lumbar spine. IMPRESSION: Large soft tissue mass at the posteromedial RIGHT hemithorax 9.2 x 5.9 x 10.0 cm in size, invading the posterior chest wall over a long segment, causing destruction of RIGHT seventh through tenth ribs and the RIGHT lateral aspect of the seventh through tenth thoracic vertebra. Extension of tumor into spinal canal causing marked compression and narrowing of the thecal sac, clinically consider cord compression. Suspected metastatic lesion at anterolateral liver 4.2 x 2.7 cm. Markedly increased stool in rectum. Tiny nonobstructing LEFT renal calculus. Significant fluid distends the upper endometrial canal with thinning of the myometrium; this could be assessed by follow-up pelvic sonography, if clinically indicated based on patient condition. Aortic Atherosclerosis (ICD10-I70.0) and Emphysema (ICD10-J43.9). Findings called to Dr. Cinda Quest On 12/02/2018 at 2253 hours. Electronically Signed   By: Lavonia Dana M.D.   On: 11/16/2018 22:57   Ct Abdomen Pelvis W Contrast  Result Date: 11/25/2018 CLINICAL DATA:  Unintended weight loss, sacral ulcer, nonlocalizing abdominal pain, shortness of breath possible pneumonia EXAM: CT CHEST, ABDOMEN AND PELVIS WITHOUT CONTRAST TECHNIQUE: Multidetector CT imaging of the chest, abdomen and pelvis was performed following the standard protocol without IV contrast. Sagittal and coronal MPR images reconstructed from axial data set. No oral or IV contrast administered. COMPARISON:  Chest radiograph 12/01/2018  FINDINGS: CT CHEST FINDINGS Cardiovascular: Atherosclerotic calcifications aorta, proximal great vessels, minimally in coronary arteries. Heart normal size. No pericardial effusion. Mediastinum/Nodes: Esophagus unremarkable. Base of cervical region normal appearance. No thoracic adenopathy. Lungs/Pleura: Emphysematous changes and central peribronchial thickening consistent with COPD. Large soft tissue mass identified at the posteromedial RIGHT hemithorax, 9.2 x 5.9 x 10.0 cm in size, invading the posterior chest wall over a long segment as well as invading 4 vertebral segments of the thoracic spine. Mass enters the spinal canal and causes significant narrowing and compression of the thecal sac, clinically consider cord compression. Mild scattered interstitial changes in the lower lobes bilaterally greater on RIGHT, minimally in the anterolateral LEFT upper lobe. Minimal scarring at the anterolateral periphery of the RIGHT middle lobe. No additional infiltrate, pleural effusion or pneumothorax. Musculoskeletal: Bone destruction of the RIGHT seventh eighth ninth and tenth vertebral bodies along the RIGHT side extending across the midline at the seventh eighth and ninth vertebra. Marked compression deformity of the T8 vertebral body. Mild superior endplate compression fracture of the superior endplate of T9. Bone destruction is also seen extending into the posterior elements at T7 and T8 as well as the RIGHT  transverse processes of T6, T7, T8, T9. 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 CT ABDOMEN PELVIS FINDINGS Hepatobiliary: Gallbladder mildly distended but otherwise unremarkable. No biliary dilatation. Poorly defined mass at anterolateral liver likely metastatic lesion 4.2 x 2.7 cm image 17. Pancreas: Mildly atrophic pancreas without mass Spleen: Normal appearance Adrenals/Urinary Tract: Adrenal glands normal appearance. Tiny nonobstructing LEFT renal calculus. Kidneys, ureters, and  bladder normal appearance Stomach/Bowel: Markedly increased stool in rectum. Stomach decompressed. Unable to exclude wall thickening of the distal gastric antrum/pyloric region. Remaining bowel loops unremarkable. Vascular/Lymphatic: Atherosclerotic calcifications aorta and extensively and iliac arteries without aneurysm. Significant thrombus in the distal abdominal aorta markedly narrowing the lumen. No adenopathy. Reproductive: Significant fluid distends the upper endometrial canal with thinning of the myometrium. Atrophic ovaries. Other: No free air free fluid. No hernia. Musculoskeletal: Diffuse osseous demineralization. Compression fractures of inferior L1 and superior L2. Scoliosis and degenerative changes of the lumbar spine. IMPRESSION: Large soft tissue mass at the posteromedial RIGHT hemithorax 9.2 x 5.9 x 10.0 cm in size, invading the posterior chest wall over a long segment, causing destruction of RIGHT seventh through tenth ribs and the RIGHT lateral aspect of the seventh through tenth thoracic vertebra. Extension of tumor into spinal canal causing marked compression and narrowing of the thecal sac, clinically consider cord compression. Suspected metastatic lesion at anterolateral liver 4.2 x 2.7 cm. Markedly increased stool in rectum. Tiny nonobstructing LEFT renal calculus. Significant fluid distends the upper endometrial canal with thinning of the myometrium; this could be assessed by follow-up pelvic sonography, if clinically indicated based on patient condition. Aortic Atherosclerosis (ICD10-I70.0) and Emphysema (ICD10-J43.9). Findings called to Dr. Cinda Quest On 11/15/2018 at 2253 hours. Electronically Signed   By: Lavonia Dana M.D.   On: 11/10/2018 22:57   Dg Chest Portable 1 View  Result Date: 11/05/2018 CLINICAL DATA:  Weakness. EXAM: PORTABLE CHEST 1 VIEW COMPARISON:  None. FINDINGS: No pneumothorax. Prominence of the right hilum with perihilar haziness is identified. The heart, left hilum,  and mediastinum are normal. No other acute abnormalities are identified. IMPRESSION: Prominence of the right hilum with right perihilar haziness. This could represent infiltrate/pneumonia versus a mass. Recommend a CT scan for further evaluation. Electronically Signed   By: Dorise Bullion III M.D   On: 11/26/2018 21:04    Scheduled Meds:  collagenase   Topical Daily   enoxaparin (LOVENOX) injection  30 mg Subcutaneous Q24H   Continuous Infusions:  sodium chloride 75 mL/hr at 12/05/18 2305   ampicillin-sulbactam (UNASYN) IV 1.5 g (12/06/18 0636)   potassium chloride 10 mEq (12/06/18 1338)     LOS: 1 day    Time spent: 45 minutes  Lorella Nimrod, MD [facility] Pager [phone]  If 7PM-7AM, please contact night-coverage [email or web address] Password [email or web address] 12/06/2018, 2:16 PM

## 2018-12-06 NOTE — Progress Notes (Signed)
Large bowel impaction removed. Pt cleaned and skin care given. Wound nurse in to evaluate pt.

## 2018-12-06 NOTE — Progress Notes (Signed)
PT Cancellation Note  Patient Details Name: JERRY AKRAM MRN: [id number] DOB: October 19, 1950   Cancelled Treatment:    Reason Eval/Treat Not Completed: Medical issues which prohibited therapy.  Order received and chart reviewed.  Pt currently documented to have K+ value of 2.4, which is outside safe parameters to work with PT.  Will follow and re-attempt when pt is more medically appropriate.  Roxanne Gates, PT, DPT  Roxanne Gates 12/06/2018, 2:45 PM

## 2018-12-06 NOTE — Progress Notes (Signed)
OT Cancellation Note  Patient Details Name: Sandra Maddox MRN: [id number] DOB: 03-07-1950   Cancelled Treatment:    Reason Eval/Treat Not Completed: Medical issues which prohibited therapy;Other (comment). Thank you for the OT consult. Order received and chart reviewed. Pt noted to have most recent K+ lab value of 2.4, which is outside of the parameters recommended for therapy. Pt also noted to have pending palliative care consult to establish goals of care. Will follow acutely and re-attempt at a later time/date as pt medically appropriate and able to participate in OT evaluation.   Shara Blazing, M.S., OTR/L Ascom: [phone] 12/06/18, 8:44 AM

## 2018-12-07 DIAGNOSIS — Z7189 Other specified counseling: Secondary | ICD-10-CM

## 2018-12-07 DIAGNOSIS — Z515 Encounter for palliative care: Secondary | ICD-10-CM

## 2018-12-07 DIAGNOSIS — E43 Unspecified severe protein-calorie malnutrition: Secondary | ICD-10-CM

## 2018-12-07 DIAGNOSIS — R627 Adult failure to thrive: Secondary | ICD-10-CM

## 2018-12-07 DIAGNOSIS — C799 Secondary malignant neoplasm of unspecified site: Secondary | ICD-10-CM

## 2018-12-07 DIAGNOSIS — L8945 Pressure ulcer of contiguous site of back, buttock and hip, unstageable: Secondary | ICD-10-CM

## 2018-12-07 LAB — COMPREHENSIVE METABOLIC PANEL
ALT: 26 U/L (ref 0–44)
AST: 65 U/L — ABNORMAL HIGH (ref 15–41)
Albumin: 2 g/dL — ABNORMAL LOW (ref 3.5–5.0)
Alkaline Phosphatase: 101 U/L (ref 38–126)
Anion gap: 21 — ABNORMAL HIGH (ref 5–15)
BUN: 8 mg/dL (ref 8–23)
CO2: 14 mmol/L — ABNORMAL LOW (ref 22–32)
Calcium: 10.1 mg/dL (ref 8.9–10.3)
Chloride: 112 mmol/L — ABNORMAL HIGH (ref 98–111)
Creatinine, Ser: 0.4 mg/dL — ABNORMAL LOW (ref 0.44–1.00)
GFR calc Af Amer: >60 mL/min (ref >60)
GFR calc non Af Amer: >60 mL/min (ref >60)
Glucose, Bld: 55 mg/dL — ABNORMAL LOW (ref 70–99)
Potassium: 2.9 mmol/L — ABNORMAL LOW (ref 3.5–5.1)
Sodium: 147 mmol/L — ABNORMAL HIGH (ref 135–145)
Total Bilirubin: 1.6 mg/dL — ABNORMAL HIGH (ref 0.3–1.2)
Total Protein: 5.1 g/dL — ABNORMAL LOW (ref 6.5–8.1)

## 2018-12-07 LAB — PHOSPHORUS: Phosphorus: 1.1 mg/dL — ABNORMAL LOW (ref 2.5–4.6)

## 2018-12-07 LAB — MAGNESIUM: Magnesium: 1.6 mg/dL — ABNORMAL LOW (ref 1.7–2.4)

## 2018-12-07 LAB — CBC
HCT: 32.4 % — ABNORMAL LOW (ref 36.0–46.0)
Hemoglobin: 11 g/dL — ABNORMAL LOW (ref 12.0–15.0)
MCH: 34.5 pg — ABNORMAL HIGH (ref 26.0–34.0)
MCHC: 34 g/dL (ref 30.0–36.0)
MCV: 101.6 fL — ABNORMAL HIGH (ref 80.0–100.0)
Platelets: 133 10*3/uL — ABNORMAL LOW (ref 150–400)
RBC: 3.19 MIL/uL — ABNORMAL LOW (ref 3.87–5.11)
RDW: 16 % — ABNORMAL HIGH (ref 11.5–15.5)
WBC: 16.8 10*3/uL — ABNORMAL HIGH (ref 4.0–10.5)
nRBC: 0.2 % (ref 0.0–0.2)

## 2018-12-07 MED ORDER — JEVITY 1.2 CAL PO LIQD: 1000.0000 mL | ORAL | Status: DC

## 2018-12-07 MED ORDER — DEXTROSE IN LACTATED RINGERS 5 % IV SOLN
INTRAVENOUS | Status: DC
  Administered 2018-12-07: 23:00:00 via INTRAVENOUS

## 2018-12-07 MED ORDER — MAGNESIUM SULFATE 2 GM/50ML IV SOLN
2.0000 g | Freq: Once | INTRAVENOUS | Status: AC
  Administered 2018-12-07: 2 g via INTRAVENOUS
  Filled 2018-12-07: qty 50

## 2018-12-07 MED ORDER — POTASSIUM CHLORIDE 10 MEQ/100ML IV SOLN
10.0000 meq | INTRAVENOUS | Status: AC
  Administered 2018-12-07 (×6): 10 meq via INTRAVENOUS
  Filled 2018-12-07 (×5): qty 100

## 2018-12-07 MED ORDER — POTASSIUM PHOSPHATES 15 MMOLE/5ML IV SOLN
30.0000 mmol | Freq: Once | INTRAVENOUS | Status: AC
  Administered 2018-12-07: 16:00:00 30 mmol via INTRAVENOUS
  Filled 2018-12-07: qty 10

## 2018-12-07 NOTE — Progress Notes (Addendum)
Notified MD Amin to verify if it ok if i hold off on the D5LR and keep the NS going until she is finished with her K, Kphos, and antibotics? its not all compatible? Ok to wait to start the D5LR per MD

## 2018-12-07 NOTE — Progress Notes (Signed)
PT Cancellation Note  Patient Details Name: Sandra Maddox MRN: [id number] DOB: [birth year]   Cancelled Treatment:    Reason Eval/Treat Not Completed: Patient not medically ready.  Pt still with K+ levels below range to work with PT.  Pt also pending consult with palliative care.  Will hold until pt is appropriate.  Roxanne Gates, PT, DPT  Roxanne Gates 12/07/2018, 8:14 AM

## 2018-12-07 NOTE — Progress Notes (Addendum)
Palliative GOC meeting scheduled for tomorrow at 10am with patient's friend, Laverna Peace.   NO CHARGE  Ihor Dow, [place], FNP-C Palliative Medicine Team  Phone: [phone] Fax: [phone]

## 2018-12-07 NOTE — Progress Notes (Signed)
PMT consult received, chart reviewed, and discussed with Dr. Reesa Chew. VM left for [place] social worker, Adele Schilder. Awaiting return call to discuss goals of care.   NO CHARGE  Ihor Dow, [place], FNP-C Palliative Medicine Team  Phone: [phone] Fax: [phone]

## 2018-12-07 NOTE — Progress Notes (Signed)
DC neuro checks Q4 hour and q4 CBG checks per MD Reesa Chew

## 2018-12-07 NOTE — Progress Notes (Signed)
Nutrition Follow-up  DOCUMENTATION CODES:   Severe malnutrition in context of chronic illness, Underweight  INTERVENTION:   According to the ASPEN guidelines for Nutrition Therapy for End-of-Life situations, "Artificial nutrition and hydration does not improve outcomes in terminally ill patients and may at times increase patient distress." Tube feeding in pts with dementia does not improve quality or quantity of life.Will continue to monitor plan of care.  If plan is to proceed with initiation of tube feeds recommend placement of small bore feeding tube (10Fr. Dobbhoff tube). Prior to initiating tube feed recommend replacing phosphorus level to >/= 2. Once placement is confirmed recommend initiation of Osmolite 1.5cal at 20 ml per hr and advance by 10 ml/hr every 12 hours to goal rate of 40 ml/hr. Provides 1440kcal, 60g PRO, 730 ml h2o.  Pt is at high risk for refeeding recommend close monitoring of potassium, phosphorus and magnesium until electrolytes are stable at goal rate.   If tube feeds are initiated recommend minimum free water flush of 30 ml q4hrs to maintain tube patency as patient is on IV fluids. If plan is to meet hydration needs with free water flush recommend providing 75 ml q4hrs. This would provide a total of 1180 ml h2o daily including water in tube feeding regimen.   NUTRITION DIAGNOSIS:   Severe Malnutrition related to chronic illness(Parkinson's disease, Alzheimer's disease) as evidenced by severe fat depletion, severe muscle depletion.   Ongoing.  GOAL:   Patient will meet greater than or equal to 90% of their needs   Not met at this time  MONITOR:   Diet advancement, Labs, Weight trends, Skin, I & O's  REASON FOR ASSESSMENT:   Malnutrition Screening Tool, Consult Assessment of nutrition requirement/status, Enteral/tube feeding initiation and management  ASSESSMENT:   68 year old female with PMHx of Alzheimer's disease, Parkinson's disease, hepatitis  admitted with encephalopathy, FTT, staph Capitis bacteremia, pressure ulcers.  Pt is more alert today but speech incoherent. Laying in fetal position and noticed pt wincing when adjusted in bed. SLP was in room when arrived and approved sips of thin liquids from teaspoon for comfort. Discussed with nurse and pt is receiving electrolyte replacement.   Pending goals of care discussion plan may be for initiation of tube feeds. Pt is at high risk for refeeding.  Medications reviewed: UNASYN  Labs reviewed: Phosphorus (1.1), Magnesium (1.6), Potassium (2.9)  Diet Order:   Diet Order            Diet NPO time specified  Diet effective now              EDUCATION NEEDS:   No education needs have been identified at this time  Skin:  Skin Assessment: Skin Integrity Issues:(unstageable sacrum; stg IIs to bilateral heels and left buttocks)  Last BM:  12/06/2018 - large type 2  Height:   Ht Readings from Last 1 Encounters:  11/04/2018 _0  (1.575 m)    Weight:   Wt Readings from Last 1 Encounters:  12/05/18 32.5 kg    Ideal Body Weight:  50 kg  BMI:  Body mass index is 13.1 kg/m.  Estimated Nutritional Needs:   Kcal:  1200-1400  Protein:  55-65 grams  Fluid:  1.2-1.4 L/day   Allen Norris Dietetic Intern Pager # [phone]

## 2018-12-07 NOTE — Consult Note (Signed)
Consultation Note Date: 12/07/18  Patient Name: Sandra Maddox  DOB: May 28, 1950  MRN: [id number]  Age / Sex: 68 y.o., female  PCP: Patient, No Pcp Per Referring Physician: Lorella Nimrod, MD  Reason for Consultation: Establishing goals of care  HPI/Patient Profile: 68 y.o. female  with past medical history of Parkinson's, ? Dementia, hepatitis C, Crohn's disease, currently on hospice services at home admitted on 11/16/2018 with altered mental status. In ED, CT ab/pelvis revealed large soft tissue mass at the right posteromedial right hemithorax 9.2x5.9x10cm in size invading the posterior chest wall causing destruction of right seventh through tenth ribs and right lateral aspect of of seventh through tenth thoracic vertebra. Extension of tumor into spinal canal causing marked compression and suspected metastatic lesion at anterolateral liver. Antibiotics initiated for possible pneumonia. Adult failure to thrive with decubitus pressure ulcers and protein calorie malnutrition. Palliative medicine consultation for goals of care.   Clinical Assessment and Goals of Care:  I have reviewed medical records and discussed in detail with care team including Dr. Reesa Chew, Dyanne Carrel NP, SLP, and  SW. Apparently, patient was receiving hospice services at home prior to discharge. Only known support system is a friend, Financial risk analyst.   Assessed the patient at bedside while speech was working with her. Sandra Maddox is awake and alert but disoriented. She is mumbling. She does not appear to be in pain or discomfort. She is spitting out sips provided to her by speech.   APS has been following. Voicemail left this AM with DSS Adele Schilder requesting return call.   This afternoon, attempted to call patient's friend Sandra Maddox to discuss goals of care. VM left.    ADDENDUM 1610: Received call back from patient's best friend, Sandra Maddox.  They have been friends for 20+ years and Sandra Maddox is Sandra Maddox's only support system in [place].   Sandra Maddox shares that the patient has a sister and nieces/nephews in [place] but sister is 93+ years older than the patient and patient rarely in contact with her family. Sandra Maddox's husband died 7 years ago.   Discussed events leading up to admission. Patient has been failing to thrive at home, for at least 6 weeks with declining functional status and very poor nutritional status. Sandra Maddox has been begging [name] to go see her doctors but she refused. "Head-strong" and "stubborn." "I couldn't make her go." Finally, Sandra Maddox agreed to go see PCP--home hospice services were initiated. APS just became involved the weekend prior to admit.   Sandra Maddox checks in on [place] at least once a day but cannot provider 24 hour care for her, with her own medical conditions.   Sandra Maddox reports that the patient does NOT have a documented living will but has previously spoke of Sandra Maddox's wishes against resuscitation and "didn't want tubes hanging out of her." Sandra Maddox states "when it's her time to go, she wants to go." Sandra Maddox confirms Sandra Maddox's DNR code status.  Discussed course of hospitalization including diagnoses, interventions, plan of care. Frankly and compassionately discussed poor prognosis with likely metastatic cancer  and severe protein calorie malnutrition. Explained that she is not a candidate for aggressive oncology workup with poor cognitive/functional/nutritional status. Educated against feeding tube placement with Sandra Maddox's current cognitive status and fear that this will not change cancer diagnoses and underlying Parkinson's disease.   No decisions are made today. Answered all questions. Sandra Maddox is hopeful to visit with Sandra Maddox tomorrow and see if she can participate in conversation. We plan to meet at 10am.    SUMMARY OF RECOMMENDATIONS    DNR  Continue medical management  [place] meeting scheduled with patient's friend Sandra Maddox) at [employer].   Code Status/Advance Care Planning:  DNR  Symptom Management:   Per attending  Palliative Prophylaxis:   Aspiration, Delirium Protocol, Frequent Pain Assessment, Oral Care and Turn Reposition  Psycho-social/Spiritual:   Desire for further Chaplaincy support:yes  Additional Recommendations: Caregiving  Support/Resources, Compassionate Wean Education and Education on Hospice  Prognosis:   Poor prognosis  Discharge Planning: To Be Determined      Primary Diagnoses: Present on Admission: . Altered mental status . Hypercalcemia . Dehydration . Mass in chest . Failure to thrive (0-17) . Hypoalbuminemia . Chronic hepatitis C without hepatic coma ([place]) . Transaminitis . Community acquired pneumonia . Parkinson disease ([place])   I have reviewed the medical record, interviewed the patient and family, and examined the patient. The following aspects are pertinent.  Past Medical History:  Diagnosis Date  . Alzheimer disease ([place])   . Cerebellar ataxia ([place])   . Hepatitis   . Parkinson disease [facility])    Social History   Socioeconomic History  . Marital status: Widowed    Spouse name: Not on file  . Number of children: Not on file  . Years of education: Not on file  . Highest education level: Not on file  Occupational History  . Not on file  Social Needs  . Financial resource strain: Not on file  . Food insecurity    Worry: Not on file    Inability: Not on file  . Transportation needs    Medical: Not on file    Non-medical: Not on file  Tobacco Use  . Smoking status: Never Smoker  . Smokeless tobacco: Never Used  Substance and Sexual Activity  . Alcohol use: Not Currently  . Drug use: Not Currently  . Sexual activity: Not Currently  Lifestyle  . Physical activity    Days per week: Not on file    Minutes per session: Not on file  . Stress: Not on file  Relationships  . Social Herbalist on phone: Not on file    Gets together: Not  on file    Attends religious service: Not on file    Active member of club or organization: Not on file    Attends meetings of clubs or organizations: Not on file    Relationship status: Not on file  Other Topics Concern  . Not on file  Social History Narrative  . Not on file   No family history on file. Scheduled Meds: . collagenase   Topical Daily  . enoxaparin (LOVENOX) injection  30 mg Subcutaneous Q24H   Continuous Infusions: . sodium chloride 100 mL/hr at 12/07/18 0818  . ampicillin-sulbactam (UNASYN) IV 1.5 g (12/07/18 0526)  . feeding supplement (JEVITY 1.2 CAL)    . potassium chloride 10 mEq (12/07/18 0938)  . potassium PHOSPHATE IVPB (in mmol)     PRN Meds:.HYDROmorphone (DILAUDID) injection Medications Prior to Admission:  Prior  to Admission medications   Medication Sig Start Date End Date Taking? Authorizing Provider  alendronate (FOSAMAX) 70 MG tablet Take 70 mg by mouth once a week. 11/11/18  Yes [provider]  aspirin 325 MG tablet Take 325 mg by mouth daily.   Yes [provider]  baclofen (LIORESAL) 10 MG tablet Take 5-10 mg by mouth See admin instructions. Take  tablet (5mg ) by mouth twice daily and take 2 tablet (10mg ) at bedtime 12/03/18  Yes [provider]  calcitonin, salmon, (MIACALCIN/FORTICAL) 200 UNIT/ACT nasal spray Place 1 spray into both nostrils daily. 11/02/18  Yes [provider]  carbidopa-levodopa (SINEMET IR) 25-100 MG tablet Take 2 tablets by mouth 4 (four) times daily. 10/21/18  Yes [provider]  hydroxychloroquine (PLAQUENIL) 200 MG tablet Take 200 mg by mouth daily. 12/03/18  Yes [provider]  Multiple Vitamins-Minerals (MULTIVITAL PLATINUM SILVER PO) Take 1 tablet by mouth daily.   Yes [provider]  predniSONE (DELTASONE) 5 MG tablet Take 10 mg by mouth daily. 12/03/18  Yes [provider]  senna (SENOKOT) 8.6 MG TABS tablet Take 8.6 mg by mouth at bedtime as  needed for constipation. 11/23/2018  Yes [provider]  Turmeric 500 MG CAPS Take 500 mg by mouth daily.   Yes [provider]   Allergies  Allergen Reactions  . Codeine   . Morphine And Related    Review of Systems  Unable to perform ROS: Acuity of condition   Physical Exam Vitals signs and nursing note reviewed.  Constitutional:      General: She is awake.     Appearance: She is cachectic. She is ill-appearing.  HENT:     Head: Normocephalic and atraumatic.  Cardiovascular:     Rate and Rhythm: Normal rate.  Pulmonary:     Effort: No tachypnea, accessory muscle usage or respiratory distress.  Abdominal:     Tenderness: There is no abdominal tenderness.  Skin:    General: Skin is warm and dry.  Neurological:     Mental Status: She is alert.     Comments: Awake, alert, pleasantly confused. Mumbling.  Psychiatric:        Attention and Perception: She is inattentive.        Speech: Speech is delayed.        Cognition and Memory: Cognition is impaired.    Vital Signs: BP 125/75 (BP Location: Right Arm)   Pulse 96   Temp 98.5 F (36.9 C) (Axillary)   Resp 17   Ht 5\' 2"  (1.575 m)   Wt 32.5 kg   SpO2 100%   BMI 13.10 kg/m  Pain Scale: PAINAD POSS *See Group Information*: S-Acceptable,Sleep, easy to arouse Pain Score: Asleep   SpO2: SpO2: 100 % O2 Device:SpO2: 100 % O2 Flow Rate: .   IO: Intake/output summary:   Intake/Output Summary (Last 24 hours) at 12/07/2018 0949 Last data filed at 12/06/2018 2215 Gross per 24 hour  Intake 1478.4 ml  Output -  Net 1478.4 ml    LBM: Last BM Date: 12/06/18 Baseline Weight: Weight: 30.1 kg Most recent weight: Weight: 32.5 kg     Palliative Assessment/Data: PPS 20%     Time In/Out: [id number],  [id number] Time Total: 27min Greater than 50%  of this time was spent counseling and coordinating care related to the above assessment and plan.  Signed by:  Ihor Dow, DNP, FNP-C Palliative Medicine Team   Phone: [phone] Fax: [phone]   Please contact Palliative  Medicine Team phone at [phone] for questions and concerns.  For individual provider: See Shea Evans

## 2018-12-07 NOTE — Progress Notes (Signed)
Speech Language Pathology Treatment: Dysphagia  Patient Details Name: Sandra Maddox MRN: [id number] DOB: 1950-11-01 Today's Date: 12/07/2018 Time: [id number] SLP Time Calculation (min) (ACUTE ONLY): 50 min  Assessment / Plan / Recommendation Clinical Impression  Pt seen for ongoing assessment of swallow function and therapeutic trials to assess for safe toleration to initiate and oral diet today. Pt is more aware of her environment and engaged in spontaneous verbalizations of greetings, but further speech was mostly incomprehensible. Dysarthria present -- min lingual movements noted during speech. She did not state pain but grimaced x2 during session. Pt required Max support for feeding. Pt's teeth and gums are quite declined(blackened, receeding) and many teeth missing.  Pt continues to present w/ Mod-Severe oropharyngeal phase Dysphagia impacted by her Severely declined Cognitive status and mental attention/awareness to task of oral intake, swallowing. Noted pt's current Cachectic presentation and BMI of 13.10. 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 Pt remains in a fetal position on her Left side requiring much support for more upright positioning for po trials. She required Mod+ verbal/tactile stim/cues to engage in task of ice chips and TSPs of thin liquid trials. Noted reduced awareness to verbal presentation of ice chips; spoon to lips(no labial closure on utensil) but pt did open mouth to presentation w/ stim/cues each time. SLP gave light tactile stim on lips to enduce mouth opening and awareness of po task. Pt demonstrated mouth opening w/ SLP placing ice chip and tsps of water, then juice anteriorly in oral cavity allowing increased time and sensation for manipulation and A-P transfer for swallowing. Immediate coughing and wet vocal quality noted x1 each but no increase in frequency of overt s/s of  aspiration noted. Noted reduced lingual/oral movements to attend to chips and tsps of thin liquids; the bolus appeared to run posteriorly WITHOUT pt's control as labial closure after presentation was slow. Pharyngeal swallows were appreciated but it was difficult to palpate the pharynx d/t pt's positioning; audible swallows. Pt did not masticate ice chips -- suspect d/t severely poor Dentition/gum status.  NO further trial consistencies were attempted at this session today. W/ pt's adequate toleration of thin liquids via TSP, recommend continuation of such w/ further therapeutic po trials tomorrow in order to attempt to upgrade to foods. MD and NSG and Palliative Care present during session at times -- discussed this recommendation w/ them. All agreed in light of pt's Severely declined Cognitive status and increased risk for aspiration at this time. Recommend Pleasure TSPs of water w/ NSG w/ strict aspiration precautions; frequent oral care for hygiene and stimulation of swallowing w/ aspiration precautions. ST services will f/u daily w/ ongoing assessment of pt's swallow function and safety w/ least restrictive diet hopefully. Recommend discussion of [place].     HPI HPI: Pt is a 68 y.o. female with medical history significant for a Large soft tissue mass at the posteromedial RIGHT hemithorax 9.2 x 5.9 x 10.0 cm in size, invading the posterior chest wall w/ Extension of tumor into spinal canal and suspected into the Liver, Crohn's dis., Alzheimer's Dementia, Cerebellar ataxia, Parkinson's disease and Hepatitis C who presents to the ED via EMS from home due to altered mental status. History cannot be obtained from patient due to altered mental status, history was obtained from ED physician, nurse and ED Chart. Per report, Patient lives alone, but she has a friend(Anita) that has been taking care of her,  patient was said to be on Hospice and receiving [facility] services, and that she will prefer to die at home. Adult  protective services was called d/t pt's situation, and it was recommended that patient should be taken to the ED since the process of power of attorney was started, but was not yet completed and patient is currently not in a position to make decision for herself. Bedbugs was reported by EMS, but no bedbugs noted in the ED. Work up in the ED showed Leukocytosis, hypercalcemia, hypoalbuminemia, elevated transaminitis and elevated total bilirubin. Covid 19 test was negative. CT abdomen/pelvis  Showed a large soft tissue mass at the posterior medial right hemithorax (9.2 x 5.9 x 10.0cm) in size causing destruction of right 7th-10th ribs. Chest X-ray showed prominence of right hilum with perihilar haziness suspected to be infiltrate vs Mass. Pt required total assist and encouragement to participate in this session today.  Unsure of pt's baseline Cognitive status.      SLP Plan  Continue with current plan of care       Recommendations  Diet recommendations: (pleasure TSP sips of thin liquids) Liquids provided via: Teaspoon Medication Administration: Via alternative means Supervision: Full supervision/cueing for compensatory strategies;Trained caregiver to feed patient Compensations: Minimize environmental distractions;Slow rate;Small sips/bites;Multiple dry swallows after each bite/sip Postural Changes and/or Swallow Maneuvers: Seated upright 90 degrees;Upright 30-60 min after meal                General recommendations: (Palliative care f/u; Dietician f/u) Oral Care Recommendations: Oral care QID;Staff/trained caregiver to provide oral care Follow up Recommendations: Skilled Nursing facility(TBD) SLP Visit Diagnosis: Dysphagia, oropharyngeal phase (R13.12)(mental status decline) Plan: Continue with current plan of care       [place], [place], CCC-SLP , 12/07/2018, 9:55 AM

## 2018-12-07 NOTE — Progress Notes (Signed)
PROGRESS NOTE    Sandra Maddox  [id number] DOB: [birth year] DOA: 11/14/2018 PCP: Patient, No Pcp Per   Brief Narrative:  68 yo hx end stage parkinsons, chron's, hepatitis c, ?dementia admitted acute encephalopathy.  Most likely having a extensive metastatic disease.  Talked with a friend [name] who periodically checked on her.  According to her patient is under hospice care and wants to die at home.  Patient lives alone and cannot take care of herself.  No power of attorney.  Palliative care was consulted to figure out goals of care and a possible in patient hospice placement.  Subjective: Patient was little more responsive when seen this morning.  Swallow team was working with her and she was trying to take some nectar thick food.  She is talking but her words does not make much sense.  She was unable to answer any questions appropriately.  Assessment & Plan:   Principal Problem:   Altered mental status Active Problems:   Hypercalcemia   Dehydration   Mass in chest   Failure to thrive (0-17)   Hypoalbuminemia   Chronic hepatitis C without hepatic coma (HCC)   Transaminitis   Community acquired pneumonia   Parkinson disease ([place])   Pressure injury of skin   Protein-calorie malnutrition, severe  Encephalopathy with failure to thrive.  Multifactorial most likely secondary to extensive metastatic disease of unknown primary, sepsis, severe malnourishment and she cannot take care of herself.  I do not think that she is a good candidate for more extensive workout to find her primary or to get cancer treatment. Swallow team is attempting nectar thick liquids which she was able to take few bites.  We will decide about placing NG tube once goals of care are established. Patient is very emaciated and placing even a small bore feeding tube will be a challenge. -Palliative consulted to help [place] with her goals of care.  -Keep her hydrated with NS.  Electrolyte abnormalities.  Patient  has multiple electrolyte abnormalities includes hypokalemia, hypomagnesemia and hypophosphatemia.  She is at risk for refeeding syndrome.  -Replete electrolytes and monitor.  Staph capitis bacteremia.  3/4 blood culture bottles are growing staph Capitis. Waiting for sensitivity results. -Continue Unasyn for now.  Decubitus pressure ulcers.  She has multiple pressure ulcers. -Wound care was consulted and we appreciate their recommendations -Air mattress was ordered.  Objective: Vitals:   12/06/18 1521 12/06/18 2007 12/07/18 0447 12/07/18 0800  BP: 112/78 115/81 125/78 125/75  Pulse: 92 98 96   Resp: 16  17   Temp: 98.6 F (37 C) 98.9 F (37.2 C) 98.5 F (36.9 C) 98.5 F (36.9 C)  TempSrc: Axillary   Axillary  SpO2: 98% 97% 100% 100%  Weight:      Height:        Intake/Output Summary (Last 24 hours) at 12/07/2018 1253 Last data filed at 12/06/2018 2215 Gross per 24 hour  Intake 1478.4 ml  Output --  Net 1478.4 ml   Filed Weights   11/18/2018 2114 12/05/18 2044  Weight: 30.1 kg 32.5 kg    Examination:  General exam: Severely cachectic, pleasant lady, lying in a fetal position, little more receptive and alert today.  Unable to communicate appropriately. Respiratory system: Decreased breath sounds on right hemithorax, respiratory effort normal. Cardiovascular system: S1 & S2 heard, RRR. No JVD, murmurs, rubs, gallops or clicks. No pedal edema. Gastrointestinal system: Abdomen is nondistended, soft and nontender. Normal bowel sounds heard. Central nervous system: Alert but not  oriented.  Unable to perform CNS exam.  No apparent focal deficit. Extremities: Pulses intact and symmetrical. Skin: Multiple pressure ulcers. Psychiatry:  Mood & affect appropriate.   DVT prophylaxis: Lovenox Code Status: DNR Family Communication:  Disposition Plan: Palliative care is working with adult social services for goals of care.  Consultants:   Palliative care  Procedures:   Antimicrobials:   Data Reviewed: I have personally reviewed following labs and imaging studies  CBC: Recent Labs  Lab 11/07/2018 2014 12/05/18 1032 12/06/18 0454 12/07/18 0434  WBC 16.2* 15.3* 15.3* 16.8*  NEUTROABS 14.2*  --   --   --   HGB 14.2 13.6 11.6* 11.0*  HCT 43.4 42.3 35.3* 32.4*  MCV 105.9* 106.8* 105.7* 101.6*  PLT 194 156 153 [id number]*   Basic Metabolic Panel: Recent Labs  Lab 11/06/2018 2014 12/05/18 1032 12/05/18 1618 12/06/18 0454 12/07/18 0434  NA 134* 138 138 144 147*  K 4.0 3.5 3.7 2.4* 2.9*  CL 92* 98 104 108 112*  CO2 21* 18* 17* 18* 14*  GLUCOSE 94 56* 61* 69* 55*  BUN 23 21 19 14 8   CREATININE 0.62 0.46 0.46 0.37* 0.40*  CALCIUM 12.4* 11.8* 11.2* 10.7* 10.1  MG  --   --   --  1.5* 1.6*  PHOS  --   --   --   --  1.1*   GFR: Estimated Creatinine Clearance: 35 mL/min (A) (by C-G formula based on SCr of 0.4 mg/dL (L)). Liver Function Tests: Recent Labs  Lab 11/09/2018 2014 12/05/18 1032 12/06/18 0454 12/07/18 0434  AST 50* 48* 49* 65*  ALT 6 8 13 26   ALKPHOS 129* 115 90 101  BILITOT 1.6* 1.6* 1.4* 1.6*  PROT 6.8 6.6 5.5* 5.1*  ALBUMIN 2.8* 2.7* 2.2* 2.0*   No results for input(s): LIPASE, AMYLASE in the last 168 hours. No results for input(s): AMMONIA in the last 168 hours. Coagulation Profile: No results for input(s): INR, PROTIME in the last 168 hours. Cardiac Enzymes: Recent Labs  Lab 11/28/2018 2014  CKTOTAL 49   BNP (last 3 results) No results for input(s): PROBNP in the last 8760 hours. HbA1C: No results for input(s): HGBA1C in the last 72 hours. CBG: No results for input(s): GLUCAP in the last 168 hours. Lipid Profile: No results for input(s): CHOL, HDL, LDLCALC, TRIG, CHOLHDL, LDLDIRECT in the last 72 hours. Thyroid Function Tests: No results for input(s): TSH, T4TOTAL, FREET4, T3FREE, THYROIDAB in the last 72 hours. Anemia Panel: No results for input(s): VITAMINB12, FOLATE, FERRITIN, TIBC, IRON, RETICCTPCT in the last 72  hours. Sepsis Labs: Recent Labs  Lab 12/05/18 1032  PROCALCITON 0.42    Recent Results (from the past 240 hour(s))  SARS Coronavirus 2 by RT PCR (hospital order, performed in [facility] hospital lab) Nasopharyngeal Nasopharyngeal Swab     Status: None   Collection Time: 11/23/2018  8:20 PM   Specimen: Nasopharyngeal Swab  Result Value Ref Range Status   SARS Coronavirus 2 NEGATIVE NEGATIVE Final    Comment: (NOTE) If result is NEGATIVE SARS-CoV-2 target nucleic acids are NOT DETECTED. The SARS-CoV-2 RNA is generally detectable in upper and lower  respiratory specimens during the acute phase of infection. The lowest  concentration of SARS-CoV-2 viral copies this assay can detect is 250  copies / mL. A negative result does not preclude SARS-CoV-2 infection  and should not be used as the sole basis for treatment or other  patient management decisions.  A negative result may occur with  improper specimen collection / handling, submission of specimen other  than nasopharyngeal swab, presence of viral mutation(s) within the  areas targeted by this assay, and inadequate number of viral copies  (<250 copies / mL). A negative result must be combined with clinical  observations, patient history, and epidemiological information. If result is POSITIVE SARS-CoV-2 target nucleic acids are DETECTED. The SARS-CoV-2 RNA is generally detectable in upper and lower  respiratory specimens dur ing the acute phase of infection.  Positive  results are indicative of active infection with SARS-CoV-2.  Clinical  correlation with patient history and other diagnostic information is  necessary to determine patient infection status.  Positive results do  not rule out bacterial infection or co-infection with other viruses. If result is PRESUMPTIVE POSTIVE SARS-CoV-2 nucleic acids MAY BE PRESENT.   A presumptive positive result was obtained on the submitted specimen  and confirmed on repeat testing.  While  2019 novel coronavirus  (SARS-CoV-2) nucleic acids may be present in the submitted sample  additional confirmatory testing may be necessary for epidemiological  and / or clinical management purposes  to differentiate between  SARS-CoV-2 and other Sarbecovirus currently known to infect humans.  If clinically indicated additional testing with an alternate test  methodology [phone]) is advised. The SARS-CoV-2 RNA is generally  detectable in upper and lower respiratory sp ecimens during the acute  phase of infection. The expected result is Negative. Fact Sheet for Patients:  StrictlyIdeas.no Fact Sheet for Healthcare Providers: BankingDealers.co.za This test is not yet approved or cleared by the [place] FDA and has been authorized for detection and/or diagnosis of SARS-CoV-2 by FDA under an Emergency Use Authorization (EUA).  This EUA will remain in effect (meaning this test can be used) for the duration of the COVID-19 declaration under Section 564(b)(1) of the Act, 21 U.S.C. section 360bbb-3(b)(1), unless the authorization is terminated or revoked sooner. Performed at [facility], [address] [place], [place] [address]   Culture, blood (routine x 2)     Status: Abnormal (Preliminary result)   Collection Time: 11/25/2018 11:08 PM   Specimen: BLOOD  Result Value Ref Range Status   Specimen Description   Final    BLOOD LEFT ANTECUBITAL Performed at [facility], [address] [place], [place] [address]    Special Requests   Final    BOTTLES DRAWN AEROBIC AND ANAEROBIC Blood Culture adequate volume Performed at [facility], [address] [place], [place] [address]    Culture  Setup Time   Final    GRAM POSITIVE COCCI IN BOTH AEROBIC AND ANAEROBIC BOTTLES CRITICAL RESULT CALLED TO, READ BACK BY AND VERIFIED WITH: CHRIS [facility] AT [id number] 12/05/2018.PMF Performed at [facility], [address], [place],  [address]    Culture (A)  Final    STAPHYLOCOCCUS CAPITIS SUSCEPTIBILITIES TO FOLLOW Performed at [facility], [place] [address], [place],  [address]    Report Status PENDING  Incomplete  Culture, blood (routine x 2)     Status: Abnormal (Preliminary result)   Collection Time: 11/14/2018 11:10 PM   Specimen: BLOOD  Result Value Ref Range Status   Specimen Description   Final    BLOOD RIGHT ANTECUBITAL Performed at [facility], [address] [place],  [address]    Special Requests   Final    BOTTLES DRAWN AEROBIC AND ANAEROBIC Blood Culture adequate volume Performed at [facility], [address] [place],  [address]  Culture  Setup Time   Final    GRAM POSITIVE COCCI AEROBIC BOTTLE ONLY CRITICAL RESULT CALLED TO, READ BACK BY AND VERIFIED WITH: Rito Ehrlich AT [id number] 12/05/2018.PMF    Culture (A)  Final    STAPHYLOCOCCUS CAPITIS SUSCEPTIBILITIES TO FOLLOW Performed at [facility], [place] [address] [place], [place] [address]    Report Status PENDING  Incomplete  Blood Culture ID Panel (Reflexed)     Status: Abnormal   Collection Time: 11/27/2018 11:10 PM  Result Value Ref Range Status   Enterococcus species NOT DETECTED NOT DETECTED Final   Listeria monocytogenes NOT DETECTED NOT DETECTED Final   Staphylococcus species DETECTED (A) NOT DETECTED Final    Comment: Methicillin (oxacillin) susceptible coagulase negative staphylococcus. Possible blood culture contaminant (unless isolated from more than one blood culture draw or clinical case suggests pathogenicity). No antibiotic treatment is indicated for blood  culture contaminants. CRITICAL RESULT CALLED TO, READ BACK BY AND VERIFIED WITH: Rito Ehrlich AT [id number] 12/05/2018.PMF    Staphylococcus aureus (BCID) NOT DETECTED NOT DETECTED Final   Methicillin resistance NOT DETECTED NOT DETECTED Final   Streptococcus species NOT DETECTED NOT DETECTED Final    Streptococcus agalactiae NOT DETECTED NOT DETECTED Final   Streptococcus pneumoniae NOT DETECTED NOT DETECTED Final   Streptococcus pyogenes NOT DETECTED NOT DETECTED Final   Acinetobacter baumannii NOT DETECTED NOT DETECTED Final   Enterobacteriaceae species NOT DETECTED NOT DETECTED Final   Enterobacter cloacae complex NOT DETECTED NOT DETECTED Final   Escherichia coli NOT DETECTED NOT DETECTED Final   Klebsiella oxytoca NOT DETECTED NOT DETECTED Final   Klebsiella pneumoniae NOT DETECTED NOT DETECTED Final   Proteus species NOT DETECTED NOT DETECTED Final   Serratia marcescens NOT DETECTED NOT DETECTED Final   Haemophilus influenzae NOT DETECTED NOT DETECTED Final   Neisseria meningitidis NOT DETECTED NOT DETECTED Final   Pseudomonas aeruginosa NOT DETECTED NOT DETECTED Final   Candida albicans NOT DETECTED NOT DETECTED Final   Candida glabrata NOT DETECTED NOT DETECTED Final   Candida krusei NOT DETECTED NOT DETECTED Final   Candida parapsilosis NOT DETECTED NOT DETECTED Final   Candida tropicalis NOT DETECTED NOT DETECTED Final    Comment: Performed at [facility], [address] [place], [place] [address]     Radiology Studies: No results found.   Scheduled Meds:  collagenase   Topical Daily   enoxaparin (LOVENOX) injection  30 mg Subcutaneous Q24H   Continuous Infusions:  sodium chloride 100 mL/hr at 12/07/18 0818   ampicillin-sulbactam (UNASYN) IV 1.5 g (12/07/18 0526)   feeding supplement (JEVITY 1.2 CAL)     potassium chloride 10 mEq (12/07/18 1248)   potassium PHOSPHATE IVPB (in mmol)       LOS: 2 days   Time spent: 45 minutes  Lorella Nimrod, MD [facility] Pager [phone]  If 7PM-7AM, please contact night-coverage [email or web address] Password [facility] 12/07/2018, 12:53 PM

## 2018-12-07 NOTE — Progress Notes (Signed)
OT Cancellation Note  Patient Details Name: Sandra Maddox MRN: [id number] DOB: 29-May-1950   Cancelled Treatment:    Reason Eval/Treat Not Completed: Other (comment);Medical issues which prohibited therapy. OT continues to follow this pt remotely. Pt continues to have potassium levels outside of the parameters recommended for therapy (2.9 at most recent reading) and is pending palliative care consult to establish goals of care. Will continue to hold OT evaluation and follow remotely.  Shara Blazing, M.S., OTR/L Ascom: [phone] 12/07/18, 8:08 AM

## 2018-12-08 DIAGNOSIS — R1310 Dysphagia, unspecified: Secondary | ICD-10-CM

## 2018-12-08 LAB — CBC
HCT: 33.2 % — ABNORMAL LOW (ref 36.0–46.0)
Hemoglobin: 11.2 g/dL — ABNORMAL LOW (ref 12.0–15.0)
MCH: 33.6 pg (ref 26.0–34.0)
MCHC: 33.7 g/dL (ref 30.0–36.0)
MCV: 99.7 fL (ref 80.0–100.0)
Platelets: 131 10*3/uL — ABNORMAL LOW (ref 150–400)
RBC: 3.33 MIL/uL — ABNORMAL LOW (ref 3.87–5.11)
RDW: 16.6 % — ABNORMAL HIGH (ref 11.5–15.5)
WBC: 16.2 10*3/uL — ABNORMAL HIGH (ref 4.0–10.5)
nRBC: 0.2 % (ref 0.0–0.2)

## 2018-12-08 LAB — COMPREHENSIVE METABOLIC PANEL
ALT: 33 U/L (ref 0–44)
AST: 73 U/L — ABNORMAL HIGH (ref 15–41)
Albumin: 1.9 g/dL — ABNORMAL LOW (ref 3.5–5.0)
Alkaline Phosphatase: 104 U/L (ref 38–126)
Anion gap: 18 — ABNORMAL HIGH (ref 5–15)
BUN: <5 mg/dL — ABNORMAL LOW (ref 8–23)
CO2: 22 mmol/L (ref 22–32)
Calcium: 8.9 mg/dL (ref 8.9–10.3)
Chloride: 105 mmol/L (ref 98–111)
Creatinine, Ser: 0.35 mg/dL — ABNORMAL LOW (ref 0.44–1.00)
GFR calc Af Amer: >60 mL/min (ref >60)
GFR calc non Af Amer: >60 mL/min (ref >60)
Glucose, Bld: 120 mg/dL — ABNORMAL HIGH (ref 70–99)
Potassium: 2.7 mmol/L — CL (ref 3.5–5.1)
Sodium: 145 mmol/L (ref 135–145)
Total Bilirubin: 1.3 mg/dL — ABNORMAL HIGH (ref 0.3–1.2)
Total Protein: 5.2 g/dL — ABNORMAL LOW (ref 6.5–8.1)

## 2018-12-08 LAB — CULTURE, BLOOD (ROUTINE X 2)
Special Requests: ADEQUATE
Special Requests: ADEQUATE

## 2018-12-08 LAB — PHOSPHORUS: Phosphorus: 2 mg/dL — ABNORMAL LOW (ref 2.5–4.6)

## 2018-12-08 LAB — MAGNESIUM: Magnesium: 1.4 mg/dL — ABNORMAL LOW (ref 1.7–2.4)

## 2018-12-08 MED ORDER — POTASSIUM CHLORIDE CRYS ER 20 MEQ PO TBCR: 40.0000 meq | EXTENDED_RELEASE_TABLET | Freq: Two times a day (BID) | ORAL | Status: DC

## 2018-12-08 MED ORDER — MAGNESIUM SULFATE 4 GM/100ML IV SOLN
4.0000 g | Freq: Once | INTRAVENOUS | Status: AC
  Administered 2018-12-08: 4 g via INTRAVENOUS
  Filled 2018-12-08: qty 100

## 2018-12-08 MED ORDER — KCL IN DEXTROSE-NACL 40-5-0.9 MEQ/L-%-% IV SOLN
INTRAVENOUS | Status: DC
  Administered 2018-12-08 – 2018-12-10 (×5): via INTRAVENOUS
  Filled 2018-12-08 (×5): qty 1000

## 2018-12-08 MED ORDER — POTASSIUM PHOSPHATES 15 MMOLE/5ML IV SOLN
30.0000 mmol | Freq: Once | INTRAVENOUS | Status: AC
  Administered 2018-12-08: 30 mmol via INTRAVENOUS
  Filled 2018-12-08: qty 10

## 2018-12-08 NOTE — Care Management Important Message (Signed)
Important Message  Patient Details  Name: Sandra Maddox MRN: [id number] Date of Birth: Jan 27, 1951   Medicare Important Message Given:  Other (see comment)  Palliative consult with patient and family just took place prior to me seeing patient.  She wasn't able to sign Important Message from Medicare but I reviewed it with her and left a copy with the pt.   [name] 12/08/2018, 11:36 AM

## 2018-12-08 NOTE — Progress Notes (Signed)
PT Cancellation Note  Patient Details Name: Sandra Maddox MRN: [id number] DOB: August 26, 1950   Cancelled Treatment:    Reason Eval/Treat Not Completed: Medical issues which prohibited therapy(Per continued chart review, patient pending goals of care discussion with palliative care team, considering transition to comfort care.  Will continue hold until care plan and goals of care established and patient medically optimized for participation with skilled therapy services.  Will complete initial order at this time; please re-consult as medically appropriate.)   [name] H. Owens Shark, PT, DPT, NCS 12/08/18, 4:22 PM [phone]

## 2018-12-08 NOTE — Progress Notes (Signed)
Daily Progress Note   Patient Name: Sandra Maddox       Date: 12/08/2018 DOB: [birth year]  Age: 68 y.o. MRN#: [id number] Attending Physician: Annita Brod, MD Primary Care Physician: Patient, No Pcp Per Admit Date: 11/24/2018  Reason for Consultation/Follow-up: Establishing goals of care  Subjective/GOC: Patient is more awake and alert compared to yesterday. She is reoriented to the fact that she is hospitalized. She is asking for coffee and pudding. Explained that we will have SLP re-evaluate her today.   Best friend, Sandra Maddox arrives to bedside to discuss goals of care. Patient awake and alert, therefore Sandra Maddox and I attempted to discuss goals with patient. She is intermittently confused and unsure if patient grasps severity of discussion. It is important to Sandra Maddox to explain medical diagnoses and see if Sandra Maddox will be able to make her own decisions.  Introduced role of palliative medicine. Kreg Shropshire and I discussed baseline prior to admission.  Discussed in detail events leading up to admission and course of hospitalization including diagnoses, interventions, plan of care. Reviewed CT results and explained that unfortunately [name] is NOT candidate for aggressive oncology workup with extremely poor cognitive/nutritional/functional status. Frankly and compassionately discussed poor prognosis.   I attempted to elicit values and goals of care important to the patient. Advanced directives, concepts specific to code status, artifical feeding and hydration, and rehospitalization were considered and discussed. Patient does not have a documented living will but has previously spoken of her EOL wishes to Sandra Maddox, including wishes against life support measures including resuscitation.  "Didn't want tubes hanging out of her."   Educated on medical recommendation for comfort focused care with underlying probably cancer diagnosis and severe protein calorie malnutrition leading to very poor prognosis. Educated on medical recommendation against feeding tube with underlying irreversible conditions including cancer and Parkinsons. This unfortunately would just prolong inevitable at EOL.   Sandra Maddox tries to get patient to share her wishes with [place]. Patient does become tearful during discussion. She mentions wanting 'water' and 'pain controlled.'   The difference between aggressive medical intervention and comfort care was considered.  Patient becomes frustrated by the end of discussion and again continues to ask for pudding. Called SLP and asked her to re-evaluate. Encouraged Sandra Maddox to attempt further discussions with patient regarding her wishes and that she may  respond better with just Sandra Maddox.   Answered questions and concerns. PMT contact information given to [place].    Length of Stay: 3  Current Medications: Scheduled Meds:  . collagenase   Topical Daily  . enoxaparin (LOVENOX) injection  30 mg Subcutaneous Q24H    Continuous Infusions: . ampicillin-sulbactam (UNASYN) IV 1.5 g (12/08/18 0503)  . dextrose 5 % and 0.9 % NaCl with KCl 40 mEq/L 100 mL/hr at 12/08/18 1034  . potassium PHOSPHATE IVPB (in mmol) 30 mmol (12/08/18 1035)    PRN Meds: HYDROmorphone (DILAUDID) injection  Physical Exam Vitals signs and nursing note reviewed.  Constitutional:      General: She is awake.     Appearance: She is cachectic. She is ill-appearing.  HENT:     Mouth/Throat:     Dentition: Abnormal dentition.  Pulmonary:     Effort: No tachypnea, accessory muscle usage or respiratory distress.  Neurological:     Mental Status: She is alert.     Comments: Oriented to person/place. Intermittent confusion. Attempted to reorient and engage in [place] discussion.              Vital Signs: BP  117/84 (BP Location: Right Arm)   Pulse 93   Temp 98.4 F (36.9 C) (Axillary)   Resp 18   Ht 5\' 2"  (1.575 m)   Wt 32.5 kg   SpO2 94%   BMI 13.10 kg/m  SpO2: SpO2: 94 % O2 Device: O2 Device: Room Air O2 Flow Rate:    Intake/output summary:   Intake/Output Summary (Last 24 hours) at 12/08/2018 1136 Last data filed at 12/07/2018 2300 Gross per 24 hour  Intake 2534.58 ml  Output -  Net 2534.58 ml   LBM: Last BM Date: 12/06/18 Baseline Weight: Weight: 30.1 kg Most recent weight: Weight: 32.5 kg       Palliative Assessment/Data: PPS 20%      Patient Active Problem List   Diagnosis Date Noted  . Metastatic malignant neoplasm ([place])   . Palliative care by specialist   . Goals of care, counseling/discussion   . Pressure injury of skin 12/06/2018  . Protein-calorie malnutrition, severe 12/06/2018  . Altered mental status 12/05/2018  . Hypercalcemia 12/05/2018  . Dehydration 12/05/2018  . Mass in chest 12/05/2018  . Failure to thrive (0-17) 12/05/2018  . Hypoalbuminemia 12/05/2018  . Chronic hepatitis C without hepatic coma ([place]) 12/05/2018  . Transaminitis 12/05/2018  . Sacral decubitus ulcer 12/05/2018  . Community acquired pneumonia 12/05/2018  . Parkinson disease ([place]) 12/05/2018    Palliative Care Assessment & Plan   Patient Profile: 68 y.o. female  with past medical history of Parkinson's, ? Dementia, hepatitis C, Crohn's disease, currently on hospice services at home admitted on 11/28/2018 with altered mental status. In ED, CT ab/pelvis revealed large soft tissue mass at the right posteromedial right hemithorax 9.2x5.9x10cm in size invading the posterior chest wall causing destruction of right seventh through tenth ribs and right lateral aspect of of seventh through tenth thoracic vertebra. Extension of tumor into spinal canal causing marked compression and suspected metastatic lesion at anterolateral liver. Antibiotics initiated for possible pneumonia. Adult failure  to thrive with decubitus pressure ulcers and protein calorie malnutrition. Palliative medicine consultation for goals of care.   Assessment: Encephalopathy Adult failure to thrive Extensive metastatic disease of unknown primary Sepsis Severe protein calorie malnutrition Bacteremia Decubitus pressure ulcers  Recommendations/Plan:  DNR. Continue medical management.   SLP re-evaluation today. Patient more awake and asking for food/drink.  Encouraged friend, Sandra Maddox, to continue [place] discussions with patient. It is important to [place] for [name] to make medical decisions on her own if possible. (Patient more awake today but remains confused).  Educated on medical recommendation against heroic measures at EOL (including feeding tube) and encouraged interventions aimed at comfort, explaining diagnoses and poor prognosis in detail.   PMT provider will f/u in AM.   Code Status: DNR   Code Status Orders  (From admission, onward)         Start     Ordered   12/05/18 0009  Do not attempt resuscitation (DNR)  Continuous    Question Answer Comment  In the event of cardiac or respiratory ARREST Do not call a "code blue"   In the event of cardiac or respiratory ARREST Do not perform Intubation, CPR, defibrillation or ACLS   In the event of cardiac or respiratory ARREST Use medication by any route, position, wound care, and other measures to relive pain and suffering. May use oxygen, suction and manual treatment of airway obstruction as needed for comfort.      12/05/18 0011        Code Status History    This patient has a current code status but no historical code status.   Advance Care Planning Activity    Advance Directive Documentation     Most Recent Value  Type of Advance Directive  Out of facility DNR (pink MOST or yellow form)  Pre-existing out of facility DNR order (yellow form or pink MOST form)  Yellow form placed in chart (order not valid for inpatient use)  "MOST" Form  in Place?  -       Prognosis:  Poor prognosis  Discharge Planning:  To Be Determined  Care plan was discussed with patient, friend Sandra Maddox), RN, Dr. [place] Pink  Thank you for allowing the Palliative Medicine Team to assist in the care of this patient.   Time In: 1015 Time Out: 1130 Total Time 75 Prolonged Time Billed   yes      Greater than 50%  of this time was spent counseling and coordinating care related to the above assessment and plan.  Ihor Dow, DNP, FNP-C Palliative Medicine Team  Phone: [phone] Fax: [phone]  Please contact Palliative Medicine Team phone at [phone] for questions and concerns.

## 2018-12-08 NOTE — Progress Notes (Signed)
OT Cancellation Note  Patient Details Name: Sandra Maddox MRN: [id number] DOB: May 14, 1950   Cancelled Treatment:    Reason Eval/Treat Not Completed: Medical issues which prohibited therapy;Other (comment). Per chart, pt has continued potassium levels outside of the parameters recommended for therapy (2.7 as of most recent reading) and continues to await decision regarding goals of care. OT has followed pt for 3 consecutive visits (11/2; 113; 11/4). Per protocols will, complete order at this time. Please re-consult when clear plan of care is established and pt medically appropriate for OT evaluation.   Shara Blazing, M.S., OTR/L Ascom: [phone] 12/08/18, 10:45 AM

## 2018-12-08 NOTE — Progress Notes (Signed)
Speech Language Pathology Treatment: Dysphagia  Patient Details Name: Sandra Maddox MRN: [id number] DOB: [birth year] Today's Date: 12/08/2018 Time: 1150-1240 SLP Time Calculation (min) (ACUTE ONLY): 50 min  Assessment / Plan / Recommendation Clinical Impression   Pt seen for ongoing assessment of swallow function and therapeutic trials to assess for safe initiation of oral diet today. Discussed w/ MD pt's status and the risk for aspiration secondary to pt's Cognitive decline and apparent Dysphagia - MD indicated any oral intake would be for Pleasure in light of her medical illness' and decline. Palliative Care is following for GOC. Pt is more aware of her environment and engaged in spontaneous verbalizations of greetings and some topics w/ Anita(friend), but further speech was mostly incomprehensible. Dysarthria present -- min lingual movements noted during speech. She did not state pain but grimaced during positioning upright for po's -- she has many Wounds. Pt required Max support for feeding. Pt's teeth and gums are quite declined(blackened, receeding, loose) and many teeth missing.  Pt continues to present w/ Mod-Severe oropharyngeal phase Dysphagia impacted by her Severely declined Cognitive status and mental attention/awareness to task of oral intake, swallowing. Noted pt's current Cachectic presentation and BMI of 13.10. 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 She requires much support for more upright positioning for po trials. She required Mod+ verbal/tactile stim/cues to engage in po tasks of Nectar liquids by TSP and purees. Noted reduced awareness to verbal presentation of trials but when spoon was presented, she opened mouth to receive boluses. This increased awareness of po task. Pt was often talking during and with po trials given -- she then even spit out the bolus trials intermittently not fully  attending to the goal/action of swallowing. No immediate coughing or wet vocal quality noted but throat clearing occurred x 2 w/ each consistency -- this did not increase in frequency and no other overt s/s of aspiration noted. Noted reduced lingual/oral movements to attend to tsp boluses; the Nectar liquid boluses appeared to run posteriorly WITHOUT pt's control as labial closure after presentation was slow. Pharyngeal swallows were appreciated; multiple swallows noted at times. Suspect delayed swallow initiation which increases risk for aspiration to occur.   W/ pt's adequate toleration of Nectar liquids via TSP and purees in small amounts w/ Max verbal/visual cues, recommend initiation of oral diet of such per MD ok. [name], NSG and Palliative Care present/updated on results of tx session. [name] was given Education on swallowing and dysphagia; aspiration precautions; recommended dysphagia diet consistency. All agreed w/ pt's increased risk for aspiration and pulmonary decline as well as inability to meet nutrition/hydration needs orally in light of pt's Severely declined Cognitive status and Medical status. Recommend focus of Pleasure po's w/ NSG w/ strict aspiration precautions; frequent oral care for hygiene and stimulation of swallowing w/ aspiration precautions. ST services will f/u w/ ongoing assessment of pt's swallow function and safety w/ least restrictive diet hopefully. Ongoing discussion w/ Palliative Care of [place].     HPI HPI: Pt is a 68 y.o. female with medical history significant for a Large soft tissue mass at the posteromedial RIGHT hemithorax 9.2 x 5.9 x 10.0 cm in size, invading the posterior chest wall w/ Extension of tumor into spinal canal and suspected into the Liver, Crohn's dis., Alzheimer's Dementia, Cerebellar ataxia, Parkinson's disease and Hepatitis C who presents to the ED via EMS from home due to altered mental status.  History cannot be obtained from patient due to altered mental  status, history was obtained from ED physician, nurse and ED Chart. Per report, Patient lives alone, but she has a friend(Anita) that has been taking care of her, patient was said to be on Hospice and receiving [facility] services, and that she will prefer to die at home. Adult protective services was called d/t pt's situation, and it was recommended that patient should be taken to the ED since the process of power of attorney was started, but was not yet completed and patient is currently not in a position to make decision for herself. Bedbugs was reported by EMS, but no bedbugs noted in the ED. Work up in the ED showed Leukocytosis, hypercalcemia, hypoalbuminemia, elevated transaminitis and elevated total bilirubin. Covid 19 test was negative. CT abdomen/pelvis  Showed a large soft tissue mass at the posterior medial right hemithorax (9.2 x 5.9 x 10.0cm) in size causing destruction of right 7th-10th ribs. Chest X-ray showed prominence of right hilum with perihilar haziness suspected to be infiltrate vs Mass. Pt required total assist and encouragement to participate in this session today.  Unsure of pt's baseline Cognitive status.  Pt has Wounds.       SLP Plan  Continue with current plan of care       Recommendations  Diet recommendations: Dysphagia 1 (puree);Nectar-thick liquid Liquids provided via: Teaspoon;No straw Medication Administration: Crushed with puree(for safer swallowing) Supervision: Staff to assist with self feeding;Full supervision/cueing for compensatory strategies Compensations: Minimize environmental distractions;Slow rate;Small sips/bites;Lingual sweep for clearance of pocketing;Multiple dry swallows after each bite/sip;Follow solids with liquid Postural Changes and/or Swallow Maneuvers: Seated upright 90 degrees;Upright 30-60 min after meal                General recommendations: (Palliative Care following; Dietician f/u) Oral Care Recommendations: Oral care BID;Oral care before  and after PO;Staff/trained caregiver to provide oral care Follow up Recommendations: Skilled Nursing facility(TBD) SLP Visit Diagnosis: Dysphagia, oropharyngeal phase (R13.12)(Cognitive decline) Plan: Continue with current plan of care       [place], [place], CCC-SLP Watson,Katherine 12/08/2018, 3:07 PM

## 2018-12-08 NOTE — Progress Notes (Addendum)
Critical K 2.7 notified MD [place] Pink at [phone] via [place]. 0831 orders for K po, notified MD pt unable to swallow and NPO

## 2018-12-08 NOTE — Progress Notes (Signed)
PROGRESS NOTE  Sandra Maddox Sherrill [id number] DOB: October 15, 1950 DOA: 11/07/2018 PCP: Patient, No Pcp Per  HPI/Recap of past 24 hours: Patient is a 68 year old female with past medical history of end-stage Parkinson's disease, Crohn's disease, hepatitis C and suspected dementia with acute encephalopathy admitted on 10/31.  During her hospitalization, work-up revealed that she had extensive metastatic disease of unknown primary.  Communication has been with a close friend who periodically checks on her.  Her extensive family lives in [place].  Patient is unable to take care of herself and lives alone.  Palliative care has been consulted and looking at comfort care.  Patient's nutritional status has been very poor and not felt to be a good candidate for a feeding tube.  Electrolyte abnormalities replaced during her hospitalization.  She has grown 3/4 bottles of staph capitis, pansensitive.  Seen by speech therapist today.  She did not do well with nectar thick liquids, occasionally just spitting them out.  She is being put on something for comfort feeds at best.  Patient herself somewhat interacts, complains of some left arm pain   Assessment/Plan: Principal Problem:   Altered mental status/acute metabolic encephalopathy: Felt to be secondary to metastatic disease of unknown primary and malnourishment. Active Problems:   Hypercalcemia/hypokalemia/hypophosphatemia: Risk for refeeding syndrome.  Following electrolytes and replacing as needed.    Chronic hepatitis C without hepatic coma ([place]): Following transaminases.  Stable at this time.    Community acquired pneumonia    Parkinson disease [facility]): End-stage.  She is to be on Sinemet.  Given her inability to really tolerate p.o., will look at comfort care    Pressure injury of skin: Wounds are as follows: Present on admission. Pressure Injury 12/05/18 Sacrum Unstageable - Full thickness tissue loss in which the base of the ulcer is covered  by slough (yellow, tan, gray, green or brown) and/or eschar (tan, brown or black) in the wound bed. (Active)  12/05/18 2130  Location: Sacrum  Location Orientation:   Staging: Unstageable - Full thickness tissue loss in which the base of the ulcer is covered by slough (yellow, tan, gray, green or brown) and/or eschar (tan, brown or black) in the wound bed.  Wound Description (Comments):   Present on Admission: Yes     Pressure Injury 12/05/18 Hip Right;Left Stage II -  Partial thickness loss of dermis presenting as a shallow open ulcer with a red, pink wound bed without slough. (Active)  12/05/18 2130  Location: Hip  Location Orientation: Right;Left  Staging: Stage II -  Partial thickness loss of dermis presenting as a shallow open ulcer with a red, pink wound bed without slough.  Wound Description (Comments):   Present on Admission: Yes     Pressure Injury 12/05/18 Buttocks Left Stage II -  Partial thickness loss of dermis presenting as a shallow open ulcer with a red, pink wound bed without slough. (Active)  12/05/18 2130  Location: Buttocks  Location Orientation: Left  Staging: Stage II -  Partial thickness loss of dermis presenting as a shallow open ulcer with a red, pink wound bed without slough.  Wound Description (Comments):   Present on Admission: Yes   Appreciate wound care nurse help.  Staph capitis bacteremia: Pansensitive.  Will change antibiotics over to penicillin   Hypoalbuminemia/protein-calorie malnutrition, severe: Patient meets criteria related to chronic illness of her Parkinson's and Alzheimer's disease as evidenced by severe fat and muscle depletion.  She is on comfort feeds only, but eats very little.  She  would not be a candidate for a PEG tube given aspiration risks as well as likely not even tolerating the procedure to place it.  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  Patient's comorbidities such as end-stage Parkinson's and  critically ill state limit any type of biopsy or further work-up.  Her friend understands this and she will likely need comfort care.   Palliative care by specialist   Goals of care, counseling/discussion   Code Status: DNR  Family Communication: Talk to friend at bedside  Disposition Plan: Looking at hospice options   Consultants:  Palliative care  Procedures:  None  Antimicrobials:  IV Unasyn 11/2-11/4  IV penicillin 11/4-present  DVT prophylaxis: Lovenox   Objective: Vitals:   12/08/18 0446 12/08/18 1420  BP: 117/84 (!) 150/114  Pulse: 93 93  Resp:  16  Temp: 98.4 F (36.9 C) 98.2 F (36.8 C)  SpO2: 94% 99%    Intake/Output Summary (Last 24 hours) at 12/08/2018 1508 Last data filed at 12/07/2018 2300 Gross per 24 hour  Intake 1029.43 ml  Output --  Net 1029.43 ml   Filed Weights   11/05/2018 2114 12/05/18 2044  Weight: 30.1 kg 32.5 kg   Body mass index is 13.1 kg/m.  Exam:   General: Emaciated, oriented x1  HEENT: Normocephalic, atraumatic, mucous membranes are dry, poor dentition  Cardiovascular: Regular rate and rhythm, S1-S2  Respiratory: Limited inspiratory effort, scattered rails  Abdomen: Soft, nontender, nondistended, hypoactive bowel sounds  Musculoskeletal: Emaciated, 1+ pitting edema  Skin: Skin ulcers as above.  Psychiatry: Appears confused, no evidence of acute psychoses   Data Reviewed: CBC: Recent Labs  Lab 11/15/2018 2014 12/05/18 1032 12/06/18 0454 12/07/18 0434 12/08/18 0516  WBC 16.2* 15.3* 15.3* 16.8* 16.2*  NEUTROABS 14.2*  --   --   --   --   HGB 14.2 13.6 11.6* 11.0* 11.2*  HCT 43.4 42.3 35.3* 32.4* 33.2*  MCV 105.9* 106.8* 105.7* 101.6* 99.7  PLT 194 156 153 133* [id number]*   Basic Metabolic Panel: Recent Labs  Lab 12/05/18 1032 12/05/18 1618 12/06/18 0454 12/07/18 0434 12/08/18 0516  NA 138 138 144 147* 145  K 3.5 3.7 2.4* 2.9* 2.7*  CL 98 104 108 112* 105  CO2 18* 17* 18* 14* 22  GLUCOSE 56* 61*  69* 55* 120*  BUN 21 19 14 8  <5*  CREATININE 0.46 0.46 0.37* 0.40* 0.35*  CALCIUM 11.8* 11.2* 10.7* 10.1 8.9  MG  --   --  1.5* 1.6* 1.4*  PHOS  --   --   --  1.1* 2.0*   GFR: Estimated Creatinine Clearance: 35 mL/min (A) (by C-G formula based on SCr of 0.35 mg/dL (L)). Liver Function Tests: Recent Labs  Lab 12/03/2018 2014 12/05/18 1032 12/06/18 0454 12/07/18 0434 12/08/18 0516  AST 50* 48* 49* 65* 73*  ALT 6 8 13 26  33  ALKPHOS 129* 115 90 101 104  BILITOT 1.6* 1.6* 1.4* 1.6* 1.3*  PROT 6.8 6.6 5.5* 5.1* 5.2*  ALBUMIN 2.8* 2.7* 2.2* 2.0* 1.9*   No results for input(s): LIPASE, AMYLASE in the last 168 hours. No results for input(s): AMMONIA in the last 168 hours. Coagulation Profile: No results for input(s): INR, PROTIME in the last 168 hours. Cardiac Enzymes: Recent Labs  Lab 11/30/2018 2014  CKTOTAL 49   BNP (last 3 results) No results for input(s): PROBNP in the last 8760 hours. HbA1C: No results for input(s): HGBA1C in the last 72 hours. CBG: No results for  input(s): GLUCAP in the last 168 hours. Lipid Profile: No results for input(s): CHOL, HDL, LDLCALC, TRIG, CHOLHDL, LDLDIRECT in the last 72 hours. Thyroid Function Tests: No results for input(s): TSH, T4TOTAL, FREET4, T3FREE, THYROIDAB in the last 72 hours. Anemia Panel: No results for input(s): VITAMINB12, FOLATE, FERRITIN, TIBC, IRON, RETICCTPCT in the last 72 hours. Urine analysis: No results found for: COLORURINE, APPEARANCEUR, LABSPEC, PHURINE, GLUCOSEU, HGBUR, BILIRUBINUR, KETONESUR, PROTEINUR, UROBILINOGEN, NITRITE, LEUKOCYTESUR Sepsis Labs: @LABRCNTIP (procalcitonin:4,lacticidven:4)  ) Recent Results (from the past 240 hour(s))  SARS Coronavirus 2 by RT PCR (hospital order, performed in [facility] hospital lab) Nasopharyngeal Nasopharyngeal Swab     Status: None   Collection Time: 11/19/2018  8:20 PM   Specimen: Nasopharyngeal Swab  Result Value Ref Range Status   SARS Coronavirus 2 NEGATIVE NEGATIVE  Final    Comment: (NOTE) If result is NEGATIVE SARS-CoV-2 target nucleic acids are NOT DETECTED. The SARS-CoV-2 RNA is generally detectable in upper and lower  respiratory specimens during the acute phase of infection. The lowest  concentration of SARS-CoV-2 viral copies this assay can detect is 250  copies / mL. A negative result does not preclude SARS-CoV-2 infection  and should not be used as the sole basis for treatment or other  patient management decisions.  A negative result may occur with  improper specimen collection / handling, submission of specimen other  than nasopharyngeal swab, presence of viral mutation(s) within the  areas targeted by this assay, and inadequate number of viral copies  (<250 copies / mL). A negative result must be combined with clinical  observations, patient history, and epidemiological information. If result is POSITIVE SARS-CoV-2 target nucleic acids are DETECTED. The SARS-CoV-2 RNA is generally detectable in upper and lower  respiratory specimens dur ing the acute phase of infection.  Positive  results are indicative of active infection with SARS-CoV-2.  Clinical  correlation with patient history and other diagnostic information is  necessary to determine patient infection status.  Positive results do  not rule out bacterial infection or co-infection with other viruses. If result is PRESUMPTIVE POSTIVE SARS-CoV-2 nucleic acids MAY BE PRESENT.   A presumptive positive result was obtained on the submitted specimen  and confirmed on repeat testing.  While 2019 novel coronavirus  (SARS-CoV-2) nucleic acids may be present in the submitted sample  additional confirmatory testing may be necessary for epidemiological  and / or clinical management purposes  to differentiate between  SARS-CoV-2 and other Sarbecovirus currently known to infect humans.  If clinically indicated additional testing with an alternate test  methodology [phone]) is advised. The  SARS-CoV-2 RNA is generally  detectable in upper and lower respiratory sp ecimens during the acute  phase of infection. The expected result is Negative. Fact Sheet for Patients:  StrictlyIdeas.no Fact Sheet for Healthcare Providers: BankingDealers.co.za This test is not yet approved or cleared by the [place] FDA and has been authorized for detection and/or diagnosis of SARS-CoV-2 by FDA under an Emergency Use Authorization (EUA).  This EUA will remain in effect (meaning this test can be used) for the duration of the COVID-19 declaration under Section 564(b)(1) of the Act, 21 U.S.C. section 360bbb-3(b)(1), unless the authorization is terminated or revoked sooner. Performed at [facility], [address] [place], [place] [address]   Culture, blood (routine x 2)     Status: Abnormal   Collection Time: 11/25/2018 11:08 PM   Specimen: BLOOD  Result Value Ref Range Status   Specimen Description   Final  BLOOD LEFT ANTECUBITAL Performed at [facility], [address] [place], [place] [address]    Special Requests   Final    BOTTLES DRAWN AEROBIC AND ANAEROBIC Blood Culture adequate volume Performed at [facility], [address] [place], [place] [address]    Culture  Setup Time   Final    GRAM POSITIVE COCCI IN BOTH AEROBIC AND ANAEROBIC BOTTLES CRITICAL RESULT CALLED TO, READ BACK BY AND VERIFIED WITH: [facility] AT [id number] 12/05/2018.PMF Performed at [facility], [address] [place], [place] [address]    Culture (A)  Final    STAPHYLOCOCCUS CAPITIS SUSCEPTIBILITIES PERFORMED ON PREVIOUS CULTURE WITHIN THE LAST 5 DAYS. Performed at [facility], [place] [address], [place], [place] [address]    Report Status 12/08/2018 FINAL  Final  Culture, blood (routine x 2)     Status: Abnormal   Collection Time: 11/17/2018 11:10 PM   Specimen: BLOOD  Result Value Ref Range Status   Specimen  Description   Final    BLOOD RIGHT ANTECUBITAL Performed at [facility], [address], [place], [place] [address]    Special Requests   Final    BOTTLES DRAWN AEROBIC AND ANAEROBIC Blood Culture adequate volume Performed at [facility], [address] [place], [place] [address]    Culture  Setup Time   Final    GRAM POSITIVE COCCI AEROBIC BOTTLE ONLY CRITICAL RESULT CALLED TO, READ BACK BY AND VERIFIED WITH: [facility] NAZARI AT [id number] 12/05/2018.PMF Performed at [facility], [place] [address] [place], [place] [address]    Culture STAPHYLOCOCCUS CAPITIS (A)  Final   Report Status 12/08/2018 FINAL  Final   Organism ID, Bacteria STAPHYLOCOCCUS CAPITIS  Final      Susceptibility   Staphylococcus capitis - MIC*    CIPROFLOXACIN <=0.5 SENSITIVE Sensitive     ERYTHROMYCIN <=0.25 SENSITIVE Sensitive     GENTAMICIN <=0.5 SENSITIVE Sensitive     OXACILLIN <=0.25 SENSITIVE Sensitive     TETRACYCLINE <=1 SENSITIVE Sensitive     VANCOMYCIN 1 SENSITIVE Sensitive     TRIMETH/SULFA <=10 SENSITIVE Sensitive     CLINDAMYCIN <=0.25 SENSITIVE Sensitive     RIFAMPIN <=0.5 SENSITIVE Sensitive     Inducible Clindamycin NEGATIVE Sensitive     * STAPHYLOCOCCUS CAPITIS  Blood Culture ID Panel (Reflexed)     Status: Abnormal   Collection Time: 11/12/2018 11:10 PM  Result Value Ref Range Status   Enterococcus species NOT DETECTED NOT DETECTED Final   Listeria monocytogenes NOT DETECTED NOT DETECTED Final   Staphylococcus species DETECTED (A) NOT DETECTED Final    Comment: Methicillin (oxacillin) susceptible coagulase negative staphylococcus. Possible blood culture contaminant (unless isolated from more than one blood culture draw or clinical case suggests pathogenicity). No antibiotic treatment is indicated for blood  culture contaminants. CRITICAL RESULT CALLED TO, READ BACK BY AND VERIFIED WITH: Rito Ehrlich AT [id number] 12/05/2018.PMF    Staphylococcus aureus (BCID) NOT DETECTED NOT  DETECTED Final   Methicillin resistance NOT DETECTED NOT DETECTED Final   Streptococcus species NOT DETECTED NOT DETECTED Final   Streptococcus agalactiae NOT DETECTED NOT DETECTED Final   Streptococcus pneumoniae NOT DETECTED NOT DETECTED Final   Streptococcus pyogenes NOT DETECTED NOT DETECTED Final   Acinetobacter baumannii NOT DETECTED NOT DETECTED Final   Enterobacteriaceae species NOT DETECTED NOT DETECTED Final   Enterobacter cloacae complex NOT DETECTED NOT DETECTED Final   Escherichia coli NOT DETECTED NOT DETECTED Final   Klebsiella  oxytoca NOT DETECTED NOT DETECTED Final   Klebsiella pneumoniae NOT DETECTED NOT DETECTED Final   Proteus species NOT DETECTED NOT DETECTED Final   Serratia marcescens NOT DETECTED NOT DETECTED Final   Haemophilus influenzae NOT DETECTED NOT DETECTED Final   Neisseria meningitidis NOT DETECTED NOT DETECTED Final   Pseudomonas aeruginosa NOT DETECTED NOT DETECTED Final   Candida albicans NOT DETECTED NOT DETECTED Final   Candida glabrata NOT DETECTED NOT DETECTED Final   Candida krusei NOT DETECTED NOT DETECTED Final   Candida parapsilosis NOT DETECTED NOT DETECTED Final   Candida tropicalis NOT DETECTED NOT DETECTED Final    Comment: Performed at [facility], [address] [place] [address] [place], [place] [address]      Studies: No results found.  Scheduled Meds:  collagenase   Topical Daily   enoxaparin (LOVENOX) injection  30 mg Subcutaneous Q24H    Continuous Infusions:  ampicillin-sulbactam (UNASYN) IV 1.5 g (12/08/18 1348)   dextrose 5 % and 0.9 % NaCl with KCl 40 mEq/L 100 mL/hr at 12/08/18 1034   potassium PHOSPHATE IVPB (in mmol) 30 mmol (12/08/18 1035)     LOS: 3 days     Annita Brod, MD [facility]  To reach me or the doctor on call, go to: [email or web address] Password [email or web address]  12/08/2018, 3:08 PM

## 2018-12-09 DIAGNOSIS — F0280 Dementia in other diseases classified elsewhere without behavioral disturbance: Secondary | ICD-10-CM | POA: Diagnosis present

## 2018-12-09 DIAGNOSIS — K5090 Crohn's disease, unspecified, without complications: Secondary | ICD-10-CM | POA: Diagnosis present

## 2018-12-09 DIAGNOSIS — R7881 Bacteremia: Secondary | ICD-10-CM | POA: Diagnosis present

## 2018-12-09 DIAGNOSIS — B957 Other staphylococcus as the cause of diseases classified elsewhere: Secondary | ICD-10-CM | POA: Diagnosis present

## 2018-12-09 DIAGNOSIS — G309 Alzheimer's disease, unspecified: Secondary | ICD-10-CM | POA: Diagnosis present

## 2018-12-09 DIAGNOSIS — B182 Chronic viral hepatitis C: Secondary | ICD-10-CM | POA: Diagnosis present

## 2018-12-09 DIAGNOSIS — L89212 Pressure ulcer of right hip, stage 2: Secondary | ICD-10-CM | POA: Diagnosis present

## 2018-12-09 DIAGNOSIS — E8809 Other disorders of plasma-protein metabolism, not elsewhere classified: Secondary | ICD-10-CM | POA: Diagnosis not present

## 2018-12-09 DIAGNOSIS — G9341 Metabolic encephalopathy: Secondary | ICD-10-CM | POA: Diagnosis present

## 2018-12-09 DIAGNOSIS — L89150 Pressure ulcer of sacral region, unstageable: Secondary | ICD-10-CM | POA: Diagnosis present

## 2018-12-09 DIAGNOSIS — Z66 Do not resuscitate: Secondary | ICD-10-CM | POA: Diagnosis present

## 2018-12-09 DIAGNOSIS — L89222 Pressure ulcer of left hip, stage 2: Secondary | ICD-10-CM | POA: Diagnosis present

## 2018-12-09 DIAGNOSIS — Z681 Body mass index (BMI) 19 or less, adult: Secondary | ICD-10-CM | POA: Diagnosis not present

## 2018-12-09 DIAGNOSIS — E43 Unspecified severe protein-calorie malnutrition: Secondary | ICD-10-CM | POA: Diagnosis present

## 2018-12-09 DIAGNOSIS — C801 Malignant (primary) neoplasm, unspecified: Secondary | ICD-10-CM | POA: Diagnosis present

## 2018-12-09 DIAGNOSIS — Z7189 Other specified counseling: Secondary | ICD-10-CM | POA: Diagnosis not present

## 2018-12-09 DIAGNOSIS — C799 Secondary malignant neoplasm of unspecified site: Secondary | ICD-10-CM | POA: Diagnosis not present

## 2018-12-09 DIAGNOSIS — R6251 Failure to thrive (child): Secondary | ICD-10-CM | POA: Diagnosis not present

## 2018-12-09 DIAGNOSIS — J189 Pneumonia, unspecified organism: Secondary | ICD-10-CM | POA: Diagnosis present

## 2018-12-09 DIAGNOSIS — R627 Adult failure to thrive: Secondary | ICD-10-CM | POA: Diagnosis present

## 2018-12-09 DIAGNOSIS — Z515 Encounter for palliative care: Secondary | ICD-10-CM | POA: Diagnosis present

## 2018-12-09 DIAGNOSIS — C7801 Secondary malignant neoplasm of right lung: Secondary | ICD-10-CM | POA: Diagnosis present

## 2018-12-09 DIAGNOSIS — E876 Hypokalemia: Secondary | ICD-10-CM | POA: Diagnosis present

## 2018-12-09 DIAGNOSIS — R4182 Altered mental status, unspecified: Secondary | ICD-10-CM | POA: Diagnosis present

## 2018-12-09 DIAGNOSIS — Z20828 Contact with and (suspected) exposure to other viral communicable diseases: Secondary | ICD-10-CM | POA: Diagnosis present

## 2018-12-09 DIAGNOSIS — G893 Neoplasm related pain (acute) (chronic): Secondary | ICD-10-CM

## 2018-12-09 DIAGNOSIS — E860 Dehydration: Secondary | ICD-10-CM | POA: Diagnosis present

## 2018-12-09 LAB — COMPREHENSIVE METABOLIC PANEL
ALT: 34 U/L (ref 0–44)
AST: 78 U/L — ABNORMAL HIGH (ref 15–41)
Albumin: 2 g/dL — ABNORMAL LOW (ref 3.5–5.0)
Alkaline Phosphatase: 99 U/L (ref 38–126)
Anion gap: 15 (ref 5–15)
BUN: <5 mg/dL — ABNORMAL LOW (ref 8–23)
CO2: 23 mmol/L (ref 22–32)
Calcium: 8 mg/dL — ABNORMAL LOW (ref 8.9–10.3)
Chloride: 107 mmol/L (ref 98–111)
Creatinine, Ser: <0.3 mg/dL — ABNORMAL LOW (ref 0.44–1.00)
Glucose, Bld: 144 mg/dL — ABNORMAL HIGH (ref 70–99)
Potassium: 3.3 mmol/L — ABNORMAL LOW (ref 3.5–5.1)
Sodium: 145 mmol/L (ref 135–145)
Total Bilirubin: 1.4 mg/dL — ABNORMAL HIGH (ref 0.3–1.2)
Total Protein: 5.5 g/dL — ABNORMAL LOW (ref 6.5–8.1)

## 2018-12-09 LAB — CBC
HCT: 35.8 % — ABNORMAL LOW (ref 36.0–46.0)
Hemoglobin: 11.8 g/dL — ABNORMAL LOW (ref 12.0–15.0)
MCH: 33.9 pg (ref 26.0–34.0)
MCHC: 33 g/dL (ref 30.0–36.0)
MCV: 102.9 fL — ABNORMAL HIGH (ref 80.0–100.0)
Platelets: 127 10*3/uL — ABNORMAL LOW (ref 150–400)
RBC: 3.48 MIL/uL — ABNORMAL LOW (ref 3.87–5.11)
RDW: 17.1 % — ABNORMAL HIGH (ref 11.5–15.5)
WBC: 14.4 10*3/uL — ABNORMAL HIGH (ref 4.0–10.5)
nRBC: 0.3 % — ABNORMAL HIGH (ref 0.0–0.2)

## 2018-12-09 MED ORDER — CHLORHEXIDINE GLUCONATE CLOTH 2 % EX PADS
6.0000 | MEDICATED_PAD | Freq: Every day | CUTANEOUS | Status: DC
  Administered 2018-12-09 – 2018-12-11 (×3): 6 via TOPICAL

## 2018-12-09 MED ORDER — LORAZEPAM 2 MG/ML IJ SOLN
0.5000 mg | Freq: Four times a day (QID) | INTRAMUSCULAR | Status: DC | PRN
  Administered 2018-12-09 – 2018-12-11 (×4): 0.5 mg via INTRAVENOUS
  Filled 2018-12-09 (×4): qty 1

## 2018-12-09 MED ORDER — MORPHINE SULFATE (CONCENTRATE) 10 MG/0.5ML PO SOLN
5.0000 mg | Freq: Once | ORAL | Status: AC
  Administered 2018-12-09: 5 mg via ORAL
  Filled 2018-12-09: qty 0.5

## 2018-12-09 MED ORDER — BISACODYL 10 MG RE SUPP: 10.0000 mg | Freq: Every day | RECTAL | Status: DC | PRN

## 2018-12-09 MED ORDER — ORAL CARE MOUTH RINSE
15.0000 mL | Freq: Two times a day (BID) | OROMUCOSAL | Status: DC
  Administered 2018-12-09 – 2018-12-10 (×4): 15 mL via OROMUCOSAL

## 2018-12-09 MED ORDER — CHLORHEXIDINE GLUCONATE 0.12 % MT SOLN
15.0000 mL | Freq: Two times a day (BID) | OROMUCOSAL | Status: DC
  Administered 2018-12-09 – 2018-12-11 (×5): 15 mL via OROMUCOSAL
  Filled 2018-12-09 (×5): qty 15

## 2018-12-09 MED ORDER — DIPHENHYDRAMINE HCL 50 MG/ML IJ SOLN: 12.5000 mg | Freq: Once | INTRAMUSCULAR | Status: DC | PRN

## 2018-12-09 MED ORDER — CEFAZOLIN SODIUM-DEXTROSE 1-4 GM/50ML-% IV SOLN
1.0000 g | Freq: Three times a day (TID) | INTRAVENOUS | Status: DC
  Administered 2018-12-09 – 2018-12-10 (×3): 1 g via INTRAVENOUS
  Filled 2018-12-09 (×7): qty 50

## 2018-12-09 MED ORDER — DIPHENHYDRAMINE HCL 50 MG/ML IJ SOLN: 12.5000 mg | Freq: Three times a day (TID) | INTRAMUSCULAR | Status: DC | PRN

## 2018-12-09 MED ORDER — MORPHINE SULFATE (CONCENTRATE) 10 MG/0.5ML PO SOLN
5.0000 mg | Freq: Four times a day (QID) | ORAL | Status: DC
  Administered 2018-12-09 – 2018-12-11 (×8): 5 mg via SUBLINGUAL
  Filled 2018-12-09: qty 0.5
  Filled 2018-12-09 (×4): qty 1
  Filled 2018-12-09 (×3): qty 0.5

## 2018-12-09 MED ORDER — GLYCOPYRROLATE 0.2 MG/ML IJ SOLN
0.2000 mg | INTRAMUSCULAR | Status: DC | PRN
  Filled 2018-12-09: qty 1

## 2018-12-09 NOTE — Progress Notes (Signed)
SLP Cancellation Note  Patient Details Name: Sandra Maddox MRN: [id number] DOB: 09/14/1950   Cancelled treatment:       Reason Eval/Treat Not Completed: Patient at procedure or test/unavailable(chart reviewed; consulted NSG re: pt's status). NSG reported that Palliative Care NP was in the room now meeting w/ pt's friend, [name], re: [name]. Pt has been offered bites/sips of the recommended dysphagia diet w/ little oral intake overall per NSG - she often does not accept much and works orally to manage it. NSG reported no decline in respiratory effort w/ oral intake attempts; no discomfort noted. NSG felt pt was not fully attentive to the task of po's - recommended holding on the po's if this is suspected d/t the increased risk for choking/aspiration.  Recommend presentation of po's only when pt is attentive to the task; recommend continue w/ Dysphagia level 1 (puree) w/ Nectar liquids via TSP; recommend strict aspiration precautions, feeding support. MD is aware of pt's status and the concern of the dysphagia as well as being unable to meet nutrition/hydration needs fully/safely. ST services will monitor pt's status providing ongoing education as needed. NSG agreed.     Orinda Kenner, [place], CCC-SLP Watson,Katherine 12/09/2018, 1:24 PM

## 2018-12-09 NOTE — Progress Notes (Signed)
Daily Progress Note   Patient Name: Sandra Maddox       Date: 12/09/2018 DOB: 01/04/1951  Age: 68 y.o. MRN#: [id number] Attending Physician: Annita Brod, MD Primary Care Physician: Patient, No Pcp Per Admit Date: 11/27/2018  Reason for Consultation/Follow-up: Establishing goals of care  Subjective Patient awake but disoriented. She is asking to go to church and for chocolate milk. She grimaces frequently and moans in pain with any repositioning. She will accept bites and sips from best friend, Sandra Maddox at bedside.   GOC: F/u with best friend, Sandra Maddox in family waiting area.   Again discussed events leading up to admission and course of hospitalization including diagnoses, interventions, plan of care. Frankly and compassionately discussed poor prognosis, likely weeks if not less with likely metastatic cancer, poor oral intake, and high symptom burden.   Educated on medical recommendation for comfort measures and initiation of hospice services. Again, Sandra Maddox confirms that Sandra Maddox has spoken in the past of her wishes for do not resuscitate and against life-prolonging interventions such as life support/feeding tube. Explained that feeding tube would only prolong the end of her life and would not benefit her quality of life.   Sandra Maddox has previously told Sandra Maddox that she would not want to be denied food or water at the end of her life. Educated on comfort feeds, explaining SLP recommendations and aspiration precautions. Discussed good oral care.   Explained concern that Sandra Maddox appears to be in pain and recommendation to schedule medication for pain. Sandra Maddox agrees and also acknowledges that Sandra Maddox is in pain with any movement.   The patient has previously discussed her desire to die at home.  Discussed recommendation for hospice facility placement for Sandra Maddox because Sandra Maddox cannot provide 24 hour care. Sandra Maddox should not return home alone. Sandra Maddox agrees and believes her comfort and dignity will best be managed at a hospice facility. Educated on hospice philosophy and emphasized that she would not receive aggressive IVF and ABX at hospice facility.   Sandra Maddox has been in contact with patient's niece, Sandra Maddox who gives Sandra Maddox permission to make decisions. They are requesting to complete HCPOA paperwork with Sandra Maddox as POA. Explained that patient does not have capacity to make decisions and at this point, documentation cannot be completed.    **Spoke with patient's friend, Sandra Maddox via telephone by  request of Sandra Maddox. Sandra Maddox lives in [place] and is in frequent communication with the patient's niece, Sandra Maddox. Sandra Maddox states that the family wishes for Sandra Maddox to make decisions for Sandra Maddox.  **Spoke with patient's niece, Sandra Maddox via telephone this afternoon to confirm that family is ok with Sandra Maddox making decisions for Sandra Maddox. Sandra Maddox shares that her mother (the patient's sister) is 9 years older than Sandra Maddox and also with poor health status. Sandra Maddox's family lives in [place]. Sandra Maddox shares that her and her mother have not seen Sandra Maddox for over 20 years.  Sandra Maddox confirms that the family wishes for Sandra Maddox to make decisions for Sandra Maddox since they have been very close and Sandra Maddox has been her primary support system for over 30 years.   Discussed events leading up to admission and course of hospitalization including diagnoses, interventions, plan of care and poor prognosis. Educated on medical recommendation for comfort focused care and hospice facility placement. Sandra Maddox understands and agrees that it is best to keep Sandra Maddox comfortable at EOL. Sandra Maddox shares her belief that "mentally, physically, and emotionally" her Sandra Maddox has "accepted" end-of-life since diagnosis of Parkinson's.   Again, Sandra Maddox gives permission for Sandra Maddox to make all medical decisions for  Sandra Maddox. Answered all questions and concerns. PMT contact information given to niece.    Length of Stay: 4  Current Medications: Scheduled Meds:  . chlorhexidine  15 mL Mouth Rinse BID  . Chlorhexidine Gluconate Cloth  6 each Topical Daily  . collagenase   Topical Daily  . mouth rinse  15 mL Mouth Rinse q12n4p  . morphine CONCENTRATE  5 mg Sublingual Once    Continuous Infusions: .  ceFAZolin (ANCEF) IV    . dextrose 5 % and 0.9 % NaCl with KCl 40 mEq/L 100 mL/hr at 12/09/18 0800    PRN Meds: diphenhydrAMINE, glycopyrrolate, HYDROmorphone (DILAUDID) injection, LORazepam  Physical Exam Vitals signs and nursing note reviewed.  Constitutional:      General: She is awake.     Appearance: She is cachectic. She is ill-appearing.  HENT:     Mouth/Throat:     Dentition: Abnormal dentition.  Pulmonary:     Effort: No tachypnea, accessory muscle usage or respiratory distress.  Neurological:     Mental Status: She is alert.     Comments: Awake, alert to person. Disoriented.              Vital Signs: BP 104/71 (BP Location: Right Arm)   Pulse (!) 102   Temp (!) 97.5 F (36.4 C) (Oral)   Resp 20   Ht 5\' 2"  (1.575 m)   Wt 36.3 kg   SpO2 96%   BMI 14.63 kg/m  SpO2: SpO2: 96 % O2 Device: O2 Device: Room Air O2 Flow Rate:    Intake/output summary:   Intake/Output Summary (Last 24 hours) at 12/09/2018 1336 Last data filed at 12/09/2018 0900 Gross per 24 hour  Intake 2398.07 ml  Output -  Net 2398.07 ml   LBM: Last BM Date: 12/06/18 Baseline Weight: Weight: 30.1 kg Most recent weight: Weight: 36.3 kg       Palliative Assessment/Data: PPS 20%      Patient Active Problem List   Diagnosis Date Noted  . Metastatic malignant neoplasm ([place])   . Palliative care by specialist   . Goals of care, counseling/discussion   . Pressure injury of skin 12/06/2018  . Protein-calorie malnutrition, severe 12/06/2018  . Altered mental status 12/05/2018  . Hypercalcemia 12/05/2018   . Dehydration 12/05/2018  . Mass  in chest 12/05/2018  . Failure to thrive (0-17) 12/05/2018  . Hypoalbuminemia 12/05/2018  . Chronic hepatitis C without hepatic coma ([place]) 12/05/2018  . Transaminitis 12/05/2018  . Sacral decubitus ulcer 12/05/2018  . Community acquired pneumonia 12/05/2018  . Parkinson disease ([place]) 12/05/2018    Palliative Care Assessment & Plan   Patient Profile: 68 y.o. female  with past medical history of Parkinson's, ? Dementia, hepatitis C, Crohn's disease, currently on hospice services at home admitted on 11/30/2018 with altered mental status. In ED, CT ab/pelvis revealed large soft tissue mass at the right posteromedial right hemithorax 9.2x5.9x10cm in size invading the posterior chest wall causing destruction of right seventh through tenth ribs and right lateral aspect of of seventh through tenth thoracic vertebra. Extension of tumor into spinal canal causing marked compression and suspected metastatic lesion at anterolateral liver. Antibiotics initiated for possible pneumonia. Adult failure to thrive with decubitus pressure ulcers and protein calorie malnutrition. Palliative medicine consultation for goals of care.   Assessment: Encephalopathy Adult failure to thrive Extensive metastatic disease of unknown primary Sepsis Severe protein calorie malnutrition Bacteremia Decubitus pressure ulcers  Recommendations/Plan:  DNR/DNI, no heroic measures at EOL. Patient has previously told friend, Sandra Maddox of her wishes against resuscitation/life support measures.  PMT provider has spoken with patient's niece in [place]. The patient's sister is 66 years older and with poor health. The patient's family gives permission for Sandra Maddox to make medical decisions for Sandra Maddox. The patient's family has not seen her in over 74 years. Patient's niece agrees with comfort measures/hospice placement.  Transitioning towards comfort focused care.   Comfort feeds per patient/family  request. Aspiration precautions. Continue dysphagia diet.   Continue frequent oral care.  Schedule Roxanol 5mg  SL q6h. PRN dilaudid for breakthrough pain.   SW consult for hospice facility placement.  Explained to Sandra Maddox that IVF and IV antibiotics will NOT continue at hospice facility. (Did not discontinue this afternoon because Sandra Maddox did not return afternoon call. Will need to be clarified in AM).   Code Status: DNR   Code Status Orders  (From admission, onward)         Start     Ordered   12/05/18 0009  Do not attempt resuscitation (DNR)  Continuous    Question Answer Comment  In the event of cardiac or respiratory ARREST Do not call a "code blue"   In the event of cardiac or respiratory ARREST Do not perform Intubation, CPR, defibrillation or ACLS   In the event of cardiac or respiratory ARREST Use medication by any route, position, wound care, and other measures to relive pain and suffering. May use oxygen, suction and manual treatment of airway obstruction as needed for comfort.      12/05/18 0011        Code Status History    This patient has a current code status but no historical code status.   Advance Care Planning Activity    Advance Directive Documentation     Most Recent Value  Type of Advance Directive  Out of facility DNR (pink MOST or yellow form)  Pre-existing out of facility DNR order (yellow form or pink MOST form)  Yellow form placed in chart (order not valid for inpatient use)  "MOST" Form in Place?  -       Prognosis:  Less than 2 weeks  Discharge Planning:  Hospice facility  Care plan was discussed with patient, friend Sandra Maddox), friend Sandra Maddox), niece (Sandra Maddox),  RN, Dr. Maryland Pink  Thank you  for allowing the Palliative Medicine Team to assist in the care of this patient.   Time In: 1230- 1540- Time Out: 1330 1600 Total Time 80 Prolonged Time Billed   yes      Greater than 50%  of this time was spent counseling and coordinating care related  to the above assessment and plan.  Ihor Dow, DNP, FNP-C Palliative Medicine Team  Phone: [phone] Fax: [phone]  Please contact Palliative Medicine Team phone at [phone] for questions and concerns.

## 2018-12-09 NOTE — Progress Notes (Signed)
PROGRESS NOTE  Sandra Maddox [id number] DOB: April 16, 1950 DOA: 12/03/2018 PCP: Patient, No Pcp Per  HPI/Recap of past 24 hours: Patient is a 68 year old female with past medical history of end-stage Parkinson's disease, Crohn's disease, hepatitis C and suspected dementia with acute encephalopathy admitted on 10/31.  During her hospitalization, work-up revealed that she had extensive metastatic disease of unknown primary.  Communication has been with a close friend who periodically checks on her.  Her extensive family lives in [place].  Patient is unable to take care of herself and lives alone.  Palliative care has been consulted and looking at comfort care.  Patient's nutritional status has been very poor and not felt to be a good candidate for a feeding tube.  Electrolyte abnormalities replaced during her hospitalization.  She has grown 3/4 bottles of staph capitis, pansensitive.  Seen by speech therapist placed on the night she barely takes in, really comfort feeds at this point.  Reached out to her friend and eating from his inpatient care.  Assessment/Plan: Principal Problem:   Altered mental status/acute metabolic encephalopathy: Felt to be secondary to metastatic disease of unknown primary and malnourishment. Active Problems:   Hypercalcemia/hypokalemia/hypophosphatemia: Risk for refeeding syndrome.  Following electrolytes and replacing as needed.    Chronic hepatitis C without hepatic coma ([place]): Following transaminases.  Stable at this time.    Community acquired pneumonia    Parkinson disease [facility]): End-stage.  She is to be on Sinemet.  Given her inability to really tolerate p.o., will look at comfort care    Pressure injury of skin: Wounds are as follows: Present on admission. Pressure Injury 12/05/18 Sacrum Unstageable - Full thickness tissue loss in which the base of the ulcer is covered by slough (yellow, tan, gray, green or brown) and/or eschar (tan, brown or  black) in the wound bed. (Active)  12/05/18 2130  Location: Sacrum  Location Orientation:   Staging: Unstageable - Full thickness tissue loss in which the base of the ulcer is covered by slough (yellow, tan, gray, green or brown) and/or eschar (tan, brown or black) in the wound bed.  Wound Description (Comments):   Present on Admission: Yes     Pressure Injury 12/05/18 Hip Right;Left Stage II -  Partial thickness loss of dermis presenting as a shallow open ulcer with a red, pink wound bed without slough. (Active)  12/05/18 2130  Location: Hip  Location Orientation: Right;Left  Staging: Stage II -  Partial thickness loss of dermis presenting as a shallow open ulcer with a red, pink wound bed without slough.  Wound Description (Comments):   Present on Admission: Yes     Pressure Injury 12/05/18 Buttocks Left Stage II -  Partial thickness loss of dermis presenting as a shallow open ulcer with a red, pink wound bed without slough. (Active)  12/05/18 2130  Location: Buttocks  Location Orientation: Left  Staging: Stage II -  Partial thickness loss of dermis presenting as a shallow open ulcer with a red, pink wound bed without slough.  Wound Description (Comments):   Present on Admission: Yes   Appreciate wound care nurse help.  Staph capitis bacteremia: Pansensitive.  Will change antibiotics over to penicillin   Hypoalbuminemia/protein-calorie malnutrition, severe: Patient meets criteria related to chronic illness of her Parkinson's and Alzheimer's disease as evidenced by severe fat and muscle depletion.  She is on comfort feeds only, but eats very little.  She would not be a candidate for a PEG tube given aspiration risks as well  as likely not even tolerating the procedure to place it.  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)): Unknown primary.  Patient's comorbidities such as end-stage Parkinson's and critically ill state limit any type of biopsy or further work-up.  Her friend  understands this and she will likely need comfort care.  We will confirm this today   Palliative care by specialist   Goals of care, counseling/discussion   Code Status: DNR  Family Communication: Left message for [name], patient's friend  Disposition Plan: Looking at hospice options   Consultants:  Palliative care  Procedures:  None  Antimicrobials:  IV Unasyn 11/2-11/4  IV Ancef 11/5-present  DVT prophylaxis: Lovenox   Objective: Vitals:   12/08/18 1951 12/09/18 0514  BP: (!) 143/76 104/71  Pulse: 96 (!) 102  Resp: 20 20  Temp: 98.9 F (37.2 C) (!) 97.5 F (36.4 C)  SpO2: 95% 96%    Intake/Output Summary (Last 24 hours) at 12/09/2018 1237 Last data filed at 12/09/2018 0900 Gross per 24 hour  Intake 2398.07 ml  Output -  Net 2398.07 ml   Filed Weights   11/06/2018 2114 12/05/18 2044 12/09/18 0514  Weight: 30.1 kg 32.5 kg 36.3 kg   Body mass index is 14.63 kg/m.  Exam:   General: Emaciated, more somnolent  HEENT: Normocephalic, atraumatic, mucous membranes are dry, poor dentition  Cardiovascular: Regular rate and rhythm, S1-S2  Respiratory: Limited inspiratory effort, scattered rails  Abdomen: Soft, nontender, nondistended, hypoactive bowel sounds  Musculoskeletal: Emaciated, 1+ pitting edema  Skin: Skin ulcers as above.  Psychiatry: Appears confused, no evidence of acute psychoses   Data Reviewed: CBC: Recent Labs  Lab 11/27/2018 2014 12/05/18 1032 12/06/18 0454 12/07/18 0434 12/08/18 0516 12/09/18 0455  WBC 16.2* 15.3* 15.3* 16.8* 16.2* 14.4*  NEUTROABS 14.2*  --   --   --   --   --   HGB 14.2 13.6 11.6* 11.0* 11.2* 11.8*  HCT 43.4 42.3 35.3* 32.4* 33.2* 35.8*  MCV 105.9* 106.8* 105.7* 101.6* 99.7 102.9*  PLT 194 156 153 133* 131* [id number]*   Basic Metabolic Panel: Recent Labs  Lab 12/05/18 1618 12/06/18 0454 12/07/18 0434 12/08/18 0516 12/09/18 0455  NA 138 144 147* 145 145  K 3.7 2.4* 2.9* 2.7* 3.3*  CL 104 108 112* 105  107  CO2 17* 18* 14* 22 23  GLUCOSE 61* 69* 55* 120* 144*  BUN 19 14 8  <5* <5*  CREATININE 0.46 0.37* 0.40* 0.35* <0.30*  CALCIUM 11.2* 10.7* 10.1 8.9 8.0*  MG  --  1.5* 1.6* 1.4*  --   PHOS  --   --  1.1* 2.0*  --    GFR: CrCl cannot be calculated (This lab value cannot be used to calculate CrCl because it is not a number: <0.30). Liver Function Tests: Recent Labs  Lab 12/05/18 1032 12/06/18 0454 12/07/18 0434 12/08/18 0516 12/09/18 0455  AST 48* 49* 65* 73* 78*  ALT 8 13 26  33 34  ALKPHOS 115 90 101 104 99  BILITOT 1.6* 1.4* 1.6* 1.3* 1.4*  PROT 6.6 5.5* 5.1* 5.2* 5.5*  ALBUMIN 2.7* 2.2* 2.0* 1.9* 2.0*   No results for input(s): LIPASE, AMYLASE in the last 168 hours. No results for input(s): AMMONIA in the last 168 hours. Coagulation Profile: No results for input(s): INR, PROTIME in the last 168 hours. Cardiac Enzymes: Recent Labs  Lab 11/26/2018 2014  CKTOTAL 49   BNP (last 3 results) No results for input(s): PROBNP in the last 8760 hours. HbA1C: No  results for input(s): HGBA1C in the last 72 hours. CBG: No results for input(s): GLUCAP in the last 168 hours. Lipid Profile: No results for input(s): CHOL, HDL, LDLCALC, TRIG, CHOLHDL, LDLDIRECT in the last 72 hours. Thyroid Function Tests: No results for input(s): TSH, T4TOTAL, FREET4, T3FREE, THYROIDAB in the last 72 hours. Anemia Panel: No results for input(s): VITAMINB12, FOLATE, FERRITIN, TIBC, IRON, RETICCTPCT in the last 72 hours. Urine analysis: No results found for: COLORURINE, APPEARANCEUR, LABSPEC, PHURINE, GLUCOSEU, HGBUR, BILIRUBINUR, KETONESUR, PROTEINUR, UROBILINOGEN, NITRITE, LEUKOCYTESUR Sepsis Labs: @LABRCNTIP (procalcitonin:4,lacticidven:4)  ) Recent Results (from the past 240 hour(s))  SARS Coronavirus 2 by RT PCR (hospital order, performed in [facility] hospital lab) Nasopharyngeal Nasopharyngeal Swab     Status: None   Collection Time: 12/03/2018  8:20 PM   Specimen: Nasopharyngeal Swab   Result Value Ref Range Status   SARS Coronavirus 2 NEGATIVE NEGATIVE Final    Comment: (NOTE) If result is NEGATIVE SARS-CoV-2 target nucleic acids are NOT DETECTED. The SARS-CoV-2 RNA is generally detectable in upper and lower  respiratory specimens during the acute phase of infection. The lowest  concentration of SARS-CoV-2 viral copies this assay can detect is 250  copies / mL. A negative result does not preclude SARS-CoV-2 infection  and should not be used as the sole basis for treatment or other  patient management decisions.  A negative result may occur with  improper specimen collection / handling, submission of specimen other  than nasopharyngeal swab, presence of viral mutation(s) within the  areas targeted by this assay, and inadequate number of viral copies  (<250 copies / mL). A negative result must be combined with clinical  observations, patient history, and epidemiological information. If result is POSITIVE SARS-CoV-2 target nucleic acids are DETECTED. The SARS-CoV-2 RNA is generally detectable in upper and lower  respiratory specimens dur ing the acute phase of infection.  Positive  results are indicative of active infection with SARS-CoV-2.  Clinical  correlation with patient history and other diagnostic information is  necessary to determine patient infection status.  Positive results do  not rule out bacterial infection or co-infection with other viruses. If result is PRESUMPTIVE POSTIVE SARS-CoV-2 nucleic acids MAY BE PRESENT.   A presumptive positive result was obtained on the submitted specimen  and confirmed on repeat testing.  While 2019 novel coronavirus  (SARS-CoV-2) nucleic acids may be present in the submitted sample  additional confirmatory testing may be necessary for epidemiological  and / or clinical management purposes  to differentiate between  SARS-CoV-2 and other Sarbecovirus currently known to infect humans.  If clinically indicated additional  testing with an alternate test  methodology [phone]) is advised. The SARS-CoV-2 RNA is generally  detectable in upper and lower respiratory sp ecimens during the acute  phase of infection. The expected result is Negative. Fact Sheet for Patients:  StrictlyIdeas.no Fact Sheet for Healthcare Providers: BankingDealers.co.za This test is not yet approved or cleared by the [place] FDA and has been authorized for detection and/or diagnosis of SARS-CoV-2 by FDA under an Emergency Use Authorization (EUA).  This EUA will remain in effect (meaning this test can be used) for the duration of the COVID-19 declaration under Section 564(b)(1) of the Act, 21 U.S.C. section 360bbb-3(b)(1), unless the authorization is terminated or revoked sooner. Performed at [facility], [address] [place], [place] [address]   Culture, blood (routine x 2)     Status: Abnormal   Collection Time: 12/03/2018 11:08 PM   Specimen: BLOOD  Result Value Ref Range Status   Specimen Description   Final    BLOOD LEFT ANTECUBITAL Performed at [facility], [address] [place], [place] [address]    Special Requests   Final    BOTTLES DRAWN AEROBIC AND ANAEROBIC Blood Culture adequate volume Performed at [facility], [address] [place], [place] [address]    Culture  Setup Time   Final    GRAM POSITIVE COCCI IN BOTH AEROBIC AND ANAEROBIC BOTTLES CRITICAL RESULT CALLED TO, READ BACK BY AND VERIFIED WITH: CHRIS [facility] AT [id number] 12/05/2018.PMF Performed at [facility], [address] [place], [place] [address]    Culture (A)  Final    STAPHYLOCOCCUS CAPITIS SUSCEPTIBILITIES PERFORMED ON PREVIOUS CULTURE WITHIN THE LAST 5 DAYS. Performed at [facility], [place] [address] [place], [place] [address]    Report Status 12/08/2018 FINAL  Final  Culture, blood (routine x 2)     Status: Abnormal   Collection Time: 11/20/2018  11:10 PM   Specimen: BLOOD  Result Value Ref Range Status   Specimen Description   Final    BLOOD RIGHT ANTECUBITAL Performed at [facility], [address], [place], [place] [address]    Special Requests   Final    BOTTLES DRAWN AEROBIC AND ANAEROBIC Blood Culture adequate volume Performed at [facility], [address] [place], [place] [address]    Culture  Setup Time   Final    GRAM POSITIVE COCCI AEROBIC BOTTLE ONLY CRITICAL RESULT CALLED TO, READ BACK BY AND VERIFIED WITH: [facility] NAZARI AT [id number] 12/05/2018.PMF Performed at [facility], [place] [address] [place], [place] [address]    Culture STAPHYLOCOCCUS CAPITIS (A)  Final   Report Status 12/08/2018 FINAL  Final   Organism ID, Bacteria STAPHYLOCOCCUS CAPITIS  Final      Susceptibility   Staphylococcus capitis - MIC*    CIPROFLOXACIN <=0.5 SENSITIVE Sensitive     ERYTHROMYCIN <=0.25 SENSITIVE Sensitive     GENTAMICIN <=0.5 SENSITIVE Sensitive     OXACILLIN <=0.25 SENSITIVE Sensitive     TETRACYCLINE <=1 SENSITIVE Sensitive     VANCOMYCIN 1 SENSITIVE Sensitive     TRIMETH/SULFA <=10 SENSITIVE Sensitive     CLINDAMYCIN <=0.25 SENSITIVE Sensitive     RIFAMPIN <=0.5 SENSITIVE Sensitive     Inducible Clindamycin NEGATIVE Sensitive     * STAPHYLOCOCCUS CAPITIS  Blood Culture ID Panel (Reflexed)     Status: Abnormal   Collection Time: 11/19/2018 11:10 PM  Result Value Ref Range Status   Enterococcus species NOT DETECTED NOT DETECTED Final   Listeria monocytogenes NOT DETECTED NOT DETECTED Final   Staphylococcus species DETECTED (A) NOT DETECTED Final    Comment: Methicillin (oxacillin) susceptible coagulase negative staphylococcus. Possible blood culture contaminant (unless isolated from more than one blood culture draw or clinical case suggests pathogenicity). No antibiotic treatment is indicated for blood  culture contaminants. CRITICAL RESULT CALLED TO, READ BACK BY AND VERIFIED WITH: Rito Ehrlich AT  [id number] 12/05/2018.PMF    Staphylococcus aureus (BCID) NOT DETECTED NOT DETECTED Final   Methicillin resistance NOT DETECTED NOT DETECTED Final   Streptococcus species NOT DETECTED NOT DETECTED Final   Streptococcus agalactiae NOT DETECTED NOT DETECTED Final   Streptococcus pneumoniae NOT DETECTED NOT DETECTED Final   Streptococcus pyogenes NOT DETECTED NOT DETECTED Final   Acinetobacter baumannii NOT DETECTED NOT DETECTED Final   Enterobacteriaceae species NOT DETECTED NOT DETECTED Final   Enterobacter cloacae complex NOT DETECTED  NOT DETECTED Final   Escherichia coli NOT DETECTED NOT DETECTED Final   Klebsiella oxytoca NOT DETECTED NOT DETECTED Final   Klebsiella pneumoniae NOT DETECTED NOT DETECTED Final   Proteus species NOT DETECTED NOT DETECTED Final   Serratia marcescens NOT DETECTED NOT DETECTED Final   Haemophilus influenzae NOT DETECTED NOT DETECTED Final   Neisseria meningitidis NOT DETECTED NOT DETECTED Final   Pseudomonas aeruginosa NOT DETECTED NOT DETECTED Final   Candida albicans NOT DETECTED NOT DETECTED Final   Candida glabrata NOT DETECTED NOT DETECTED Final   Candida krusei NOT DETECTED NOT DETECTED Final   Candida parapsilosis NOT DETECTED NOT DETECTED Final   Candida tropicalis NOT DETECTED NOT DETECTED Final    Comment: Performed at [facility], [address], [place], [place] [address]      Studies: No results found.  Scheduled Meds: . chlorhexidine  15 mL Mouth Rinse BID  . Chlorhexidine Gluconate Cloth  6 each Topical Daily  . collagenase   Topical Daily  . enoxaparin (LOVENOX) injection  30 mg Subcutaneous Q24H  . mouth rinse  15 mL Mouth Rinse q12n4p    Continuous Infusions: .  ceFAZolin (ANCEF) IV    . dextrose 5 % and 0.9 % NaCl with KCl 40 mEq/L 100 mL/hr at 12/09/18 0800     LOS: 4 days     Annita Brod, MD [facility]  To reach me or the doctor on call, go to: [email or web address] Password [email or web address]  12/09/2018, 12:37 PM

## 2018-12-09 NOTE — Progress Notes (Signed)
Nutrition Follow-up  DOCUMENTATION CODES:   Severe malnutrition in context of chronic illness, Underweight  INTERVENTION:  Provide Magic cup TID with meals, each supplement provides 290 kcal and 9 grams of protein.  Patient remains at risk for refeeding syndrome as she is receiving dextrose in IV fluids. Continue monitoring potassium, phosphorus, and magnesium daily and replacing as needed.  NUTRITION DIAGNOSIS:   Severe Malnutrition related to chronic illness(Parkinson's disease, Alzheimer's disease) as evidenced by severe fat depletion, severe muscle depletion.  Ongoing.  GOAL:   Patient will meet greater than or equal to 90% of their needs  Not met.  MONITOR:   Diet advancement, Labs, Weight trends, Skin, I & O's  REASON FOR ASSESSMENT:   Malnutrition Screening Tool, Consult Assessment of nutrition requirement/status, Enteral/tube feeding initiation and management  ASSESSMENT:   68 year old female with PMHx of Alzheimer's disease, Parkinson's disease, hepatitis admitted with encephalopathy, FTT, staph Capitis bacteremia, pressure ulcers.  On 11/4 diet was advanced to dysphagia 1 with nectar-thick liquids by tsp for pleasure feeds. Patient only able to take in small amounts. PMT still meeting with patient's friend to discuss goals of care. Will monitor for outcome of discussions regarding goals of care.  Medications reviewed and include: D5-NS with KCl 40 mEq/L at 100 mL/hr, cefazolin.  Labs reviewed: Potassium 3.3, BUN <5, Creatinine <0.3  Diet Order:   Diet Order            DIET - DYS 1 Room service appropriate? Yes with Assist; Fluid consistency: Nectar Thick  Diet effective now             EDUCATION NEEDS:   No education needs have been identified at this time  Skin:  Skin Assessment: Skin Integrity Issues:(unstageable sacrum; stg IIs to bilateral heels and left buttocks)  Last BM:  12/06/2018 per chart  Height:   Ht Readings from Last 1 Encounters:   11/06/2018 5' 2"  (1.575 m)   Weight:   Wt Readings from Last 1 Encounters:  12/09/18 36.3 kg   Ideal Body Weight:  50 kg  BMI:  Body mass index is 14.63 kg/m.  Estimated Nutritional Needs:   Kcal:  1200-1400  Protein:  55-65 grams  Fluid:  1.2-1.4 L/day  Jacklynn Barnacle, MS, RD, LDN Office: [phone] Pager: [phone] After Hours/Weekend Pager: [phone]

## 2018-12-10 NOTE — Progress Notes (Signed)
SLP Cancellation Note  Patient Details Name: Sandra Maddox MRN: [id number] DOB: [birth year]   Cancelled treatment:       Reason Eval/Treat Not Completed: Fatigue/lethargy limiting ability to participate(chart reviewed; consulted CM/Palliative Care). Per notes, and Palliative Care, pt is more drowsy today. She is being given medication for comfort - pt is on "scheduled morphine for comfort".  Pt's dysphagia diet remains ordered and can be offered when pt is fully alert/awke and w/ strict aspiration precautions and supervision w/ all oral intake. Recommend frequent oral care for hygiene and pleasure; aspiration precautions. ST services will sign off at this time w/ NSG/MD to reconsult if needs arise. CM updated also.      Orinda Kenner, MS, CCC-SLP Watson,Katherine 12/10/2018, 3:40 PM

## 2018-12-10 NOTE — TOC Progression Note (Signed)
Transition of Care [facility]) - Progression Note    Patient Details  Name: Sandra Maddox MRN: [id number] Date of Birth: 11/17/1950  Transition of Care [facility] CM/SW Contact  [name], RN Phone Number: 12/10/2018, 12:25 PM  Clinical Narrative:     [name] is at the bedside this morning, patient is resting with eyes closed.   RNCM offered choice of hospice agency to [name] in [place].  Referral for hospice home given to [name] with [facility].     Expected Discharge Plan: [place] Barriers to Discharge: Continued Medical Work up(Palleative consult ordered)  Expected Discharge Plan and Services Expected Discharge Plan: [place]     Post Acute Care Choice: Hospice(Pt is receiving hospice care at home with [facility]) Living arrangements for the past 2 months: Single Family Home                           HH Arranged: RN, Social Work, Nurse's Aide, [place] Agency: [employer]     Representative spoke with at [place]: [place] (SDOH) Interventions    Readmission Risk Interventions No flowsheet data found.

## 2018-12-10 NOTE — Progress Notes (Addendum)
Received call from friend, [name] regarding plan of care. 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 [name] is also worried that she is very sleepy today. Educated on EOL expectations and side effect that yes, morphine may be making [name] drowsier, but that she is now more comfortable than yesterday. 'Susan Moore in a hard place' with wanting her awake/alert and uncomfortable versus giving the medication to ensure she is comfortable. [name] agrees that [name] is more comfortable than yesterday, when she was grimacing and moaning with any movement. Explained that nursing staff will continue to monitor for negative reaction to morphine. [name] agrees to continue scheduled morphine for comfort.   Educated again on transition to comfort focused care and poor prognosis. We again discussed that [name] cannot go home alone, and hospice facility will best care for her as she nears the end of life. [name] wishes for [name] to chose hospice facility. I explained that unfortunately [name] does not have capacity to make decisions for herself. Recommended hospice facility closest to [place] and her mother so they can visit [name] daily. [name] understands and shares that the [place] hospice facility is closest to her. Emphasized that [name] will not receive IVF and antibiotics at hospice facility. Emphasized ongoing symptom management and comfort feeds to ensure [name] has comfort, peace, dignity at EOL. Explained that [name] will be moved to hospice facility when bed available.   Answered all questions and concerns for [name]. Emotional support provided.   Clydene Laming, SW on plan of care.   NO CHARGE  Ihor Dow, [place], FNP-C Palliative Medicine Team  Phone: [phone] Fax: [phone]

## 2018-12-10 NOTE — Progress Notes (Signed)
New referral for [facility] hospice home received from [facility]. Patient information faxed to referral. Hospital care team  and caregiver aware there is currently no bed available. Hospice Home staff with follow up with weekend TOC if bed becomes available. [name] BSN, RN, [place] [phone]

## 2018-12-10 NOTE — Care Management Important Message (Signed)
Important Message  Patient Details  Name: HONESTY SCHWALLIE MRN: [id number] Date of Birth: [birth year]   Medicare Important Message Given:  Other (see comment)  Patient is not able to sign Important Message from [employer] and no Press photographer on file.    Juliann Pulse A Eldine Rencher 12/10/2018, 7:57 AM

## 2018-12-10 NOTE — Progress Notes (Signed)
PROGRESS NOTE  Sandra Maddox [id number] DOB: 06/02/1950 DOA: 11/13/2018 PCP: Patient, No Pcp Per  HPI/Recap of past 24 hours: Patient is a 68 year old female with past medical history of end-stage Parkinson's disease, Crohn's disease, hepatitis C and suspected dementia with acute encephalopathy admitted on 10/31.  During her hospitalization, work-up revealed that she had extensive metastatic disease of unknown primary.  Communication has been with a close friend who periodically checks on her.  Her extensive family lives in [place].  Patient is unable to take care of herself and lives alone.  Palliative care has been consulted and looking at comfort care.  Patient's nutritional status has been very poor and not felt to be a good candidate for a feeding tube.  Electrolyte abnormalities replaced during her hospitalization.  She has grown 3/4 bottles of staph capitis, pansensitive.  Seen by speech therapist placed on the night she barely takes in, really comfort feeds at this point.  After discussions with palliative care, patient's family, who live out of state, agree that her close friend and neighbor, [name], should make her medical decisions for her.  After discussion with [name], plan is for patient to go to hospice facility.  Comfort care initiated.  Assessment/Plan: Principal Problem:   Altered mental status/acute metabolic encephalopathy: Felt to be secondary to metastatic disease of unknown primary and malnourishment. Active Problems:   Hypercalcemia/hypokalemia/hypophosphatemia: Risk for refeeding syndrome.  Now that she is comfort care, stop checking lab work    Chronic hepatitis C without hepatic coma ([place]): Following transaminases.  Stable at this time.    Community acquired pneumonia    Parkinson disease [facility]): End-stage.  She is to be on Sinemet.  Given her inability to really tolerate p.o., will look at comfort care    Pressure injury of skin: Wounds are as follows:  Present on admission. Pressure Injury 12/05/18 Sacrum Unstageable - Full thickness tissue loss in which the base of the ulcer is covered by slough (yellow, tan, gray, green or brown) and/or eschar (tan, brown or black) in the wound bed. (Active)  12/05/18 2130  Location: Sacrum  Location Orientation:   Staging: Unstageable - Full thickness tissue loss in which the base of the ulcer is covered by slough (yellow, tan, gray, green or brown) and/or eschar (tan, brown or black) in the wound bed.  Wound Description (Comments):   Present on Admission: Yes     Pressure Injury 12/05/18 Hip Right;Left Stage II -  Partial thickness loss of dermis presenting as a shallow open ulcer with a red, pink wound bed without slough. (Active)  12/05/18 2130  Location: Hip  Location Orientation: Right;Left  Staging: Stage II -  Partial thickness loss of dermis presenting as a shallow open ulcer with a red, pink wound bed without slough.  Wound Description (Comments):   Present on Admission: Yes     Pressure Injury 12/05/18 Buttocks Left Stage II -  Partial thickness loss of dermis presenting as a shallow open ulcer with a red, pink wound bed without slough. (Active)  12/05/18 2130  Location: Buttocks  Location Orientation: Left  Staging: Stage II -  Partial thickness loss of dermis presenting as a shallow open ulcer with a red, pink wound bed without slough.  Wound Description (Comments):   Present on Admission: Yes   Appreciate wound care nurse help.  Staph capitis bacteremia: Pansensitive.  Will change antibiotics over to penicillin   Hypoalbuminemia/protein-calorie malnutrition, severe: Patient meets criteria related to chronic illness of her Parkinson's  and Alzheimer's disease as evidenced by severe fat and muscle depletion.  She is on comfort feeds only, but eats very little.  She would not be a candidate for a PEG tube given aspiration risks as well as likely not even tolerating the procedure to place  it.  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  Patient's comorbidities such as end-stage Parkinson's and critically ill state limit any type of biopsy or further work-up.  Appreciate palliative care intervention.  Patient will go to hospice facility  Code Status: DNR  Family Communication: Spoke with [name] on 11/5  Disposition Plan: Hospice facility when bed available   Consultants:  Palliative care  Procedures:  None  Antimicrobials:  IV Unasyn 11/2-11/4  IV Ancef 11/5-11/6  DVT prophylaxis: None-comfort care   Objective: Vitals:   12/09/18 1628 12/09/18 1953  BP: (!) 109/92 98/75  Pulse: 79 (!) 104  Resp: 20 (!) 22  Temp: 98.7 F (37.1 C) 99.1 F (37.3 C)  SpO2: 92% (!) 88%    Intake/Output Summary (Last 24 hours) at 12/10/2018 1622 Last data filed at 12/10/2018 1011 Gross per 24 hour  Intake 2306.25 ml  Output 1700 ml  Net 606.25 ml   Filed Weights   11/11/2018 2114 12/05/18 2044 12/09/18 0514  Weight: 30.1 kg 32.5 kg 36.3 kg   Body mass index is 14.63 kg/m.  Exam:   General: Emaciated, resting comfortably  Cardiovascular: Regular rate and rhythm, S1-S2  Respiratory: Limited inspiratory effort, scattered rails   Data Reviewed: CBC: Recent Labs  Lab 11/18/2018 2014 12/05/18 1032 12/06/18 0454 12/07/18 0434 12/08/18 0516 12/09/18 0455  WBC 16.2* 15.3* 15.3* 16.8* 16.2* 14.4*  NEUTROABS 14.2*  --   --   --   --   --   HGB 14.2 13.6 11.6* 11.0* 11.2* 11.8*  HCT 43.4 42.3 35.3* 32.4* 33.2* 35.8*  MCV 105.9* 106.8* 105.7* 101.6* 99.7 102.9*  PLT 194 156 153 133* 131* [id number]*   Basic Metabolic Panel: Recent Labs  Lab 12/05/18 1618 12/06/18 0454 12/07/18 0434 12/08/18 0516 12/09/18 0455  NA 138 144 147* 145 145  K 3.7 2.4* 2.9* 2.7* 3.3*  CL 104 108 112* 105 107  CO2 17* 18* 14* 22 23  GLUCOSE 61* 69* 55* 120* 144*  BUN 19 14 8  <5* <5*  CREATININE 0.46 0.37* 0.40* 0.35* <0.30*  CALCIUM 11.2* 10.7* 10.1 8.9  8.0*  MG  --  1.5* 1.6* 1.4*  --   PHOS  --   --  1.1* 2.0*  --    GFR: CrCl cannot be calculated (This lab value cannot be used to calculate CrCl because it is not a number: <0.30). Liver Function Tests: Recent Labs  Lab 12/05/18 1032 12/06/18 0454 12/07/18 0434 12/08/18 0516 12/09/18 0455  AST 48* 49* 65* 73* 78*  ALT 8 13 26  33 34  ALKPHOS 115 90 101 104 99  BILITOT 1.6* 1.4* 1.6* 1.3* 1.4*  PROT 6.6 5.5* 5.1* 5.2* 5.5*  ALBUMIN 2.7* 2.2* 2.0* 1.9* 2.0*   No results for input(s): LIPASE, AMYLASE in the last 168 hours. No results for input(s): AMMONIA in the last 168 hours. Coagulation Profile: No results for input(s): INR, PROTIME in the last 168 hours. Cardiac Enzymes: Recent Labs  Lab 11/21/2018 2014  CKTOTAL 49   BNP (last 3 results) No results for input(s): PROBNP in the last 8760 hours. HbA1C: No results for input(s): HGBA1C in the last 72 hours. CBG: No results for input(s): GLUCAP in the  last 168 hours. Lipid Profile: No results for input(s): CHOL, HDL, LDLCALC, TRIG, CHOLHDL, LDLDIRECT in the last 72 hours. Thyroid Function Tests: No results for input(s): TSH, T4TOTAL, FREET4, T3FREE, THYROIDAB in the last 72 hours. Anemia Panel: No results for input(s): VITAMINB12, FOLATE, FERRITIN, TIBC, IRON, RETICCTPCT in the last 72 hours. Urine analysis: No results found for: COLORURINE, APPEARANCEUR, LABSPEC, PHURINE, GLUCOSEU, HGBUR, BILIRUBINUR, KETONESUR, PROTEINUR, UROBILINOGEN, NITRITE, LEUKOCYTESUR Sepsis Labs: @LABRCNTIP (procalcitonin:4,lacticidven:4)  ) Recent Results (from the past 240 hour(s))  SARS Coronavirus 2 by RT PCR (hospital order, performed in [facility] hospital lab) Nasopharyngeal Nasopharyngeal Swab     Status: None   Collection Time: 11/30/2018  8:20 PM   Specimen: Nasopharyngeal Swab  Result Value Ref Range Status   SARS Coronavirus 2 NEGATIVE NEGATIVE Final    Comment: (NOTE) If result is NEGATIVE SARS-CoV-2 target nucleic acids are NOT  DETECTED. The SARS-CoV-2 RNA is generally detectable in upper and lower  respiratory specimens during the acute phase of infection. The lowest  concentration of SARS-CoV-2 viral copies this assay can detect is 250  copies / mL. A negative result does not preclude SARS-CoV-2 infection  and should not be used as the sole basis for treatment or other  patient management decisions.  A negative result may occur with  improper specimen collection / handling, submission of specimen other  than nasopharyngeal swab, presence of viral mutation(s) within the  areas targeted by this assay, and inadequate number of viral copies  (<250 copies / mL). A negative result must be combined with clinical  observations, patient history, and epidemiological information. If result is POSITIVE SARS-CoV-2 target nucleic acids are DETECTED. The SARS-CoV-2 RNA is generally detectable in upper and lower  respiratory specimens dur ing the acute phase of infection.  Positive  results are indicative of active infection with SARS-CoV-2.  Clinical  correlation with patient history and other diagnostic information is  necessary to determine patient infection status.  Positive results do  not rule out bacterial infection or co-infection with other viruses. If result is PRESUMPTIVE POSTIVE SARS-CoV-2 nucleic acids MAY BE PRESENT.   A presumptive positive result was obtained on the submitted specimen  and confirmed on repeat testing.  While 2019 novel coronavirus  (SARS-CoV-2) nucleic acids may be present in the submitted sample  additional confirmatory testing may be necessary for epidemiological  and / or clinical management purposes  to differentiate between  SARS-CoV-2 and other Sarbecovirus currently known to infect humans.  If clinically indicated additional testing with an alternate test  methodology [id number]) is advised. The SARS-CoV-2 RNA is generally  detectable in upper and lower respiratory sp ecimens during  the acute  phase of infection. The expected result is Negative. Fact Sheet for Patients:  StrictlyIdeas.no Fact Sheet for Healthcare Providers: BankingDealers.co.za This test is not yet approved or cleared by the [place] FDA and has been authorized for detection and/or diagnosis of SARS-CoV-2 by FDA under an Emergency Use Authorization (EUA).  This EUA will remain in effect (meaning this test can be used) for the duration of the COVID-19 declaration under Section 564(b)(1) of the Act, 21 U.S.C. section 360bbb-3(b)(1), unless the authorization is terminated or revoked sooner. Performed at [facility], [address] [place], [place] [address]   Culture, blood (routine x 2)     Status: Abnormal   Collection Time: 11/30/2018 11:08 PM   Specimen: BLOOD  Result Value Ref Range Status   Specimen Description   Final    BLOOD  LEFT ANTECUBITAL Performed at [facility], [address], [place], [place] [address]    Special Requests   Final    BOTTLES DRAWN AEROBIC AND ANAEROBIC Blood Culture adequate volume Performed at [facility], [address] [place], [place] [address]    Culture  Setup Time   Final    GRAM POSITIVE COCCI IN BOTH AEROBIC AND ANAEROBIC BOTTLES CRITICAL RESULT CALLED TO, READ BACK BY AND VERIFIED WITH: CHRIS [facility] AT [id number] 12/05/2018.PMF Performed at [facility], [address] [place], [place] [address]    Culture (A)  Final    STAPHYLOCOCCUS CAPITIS SUSCEPTIBILITIES PERFORMED ON PREVIOUS CULTURE WITHIN THE LAST 5 DAYS. Performed at [facility], [place] [address], [place], [place] [address]    Report Status 12/08/2018 FINAL  Final  Culture, blood (routine x 2)     Status: Abnormal   Collection Time: 11/13/2018 11:10 PM   Specimen: BLOOD  Result Value Ref Range Status   Specimen Description   Final    BLOOD RIGHT ANTECUBITAL Performed at [facility], [address] [place], [place] [address]    Special Requests   Final    BOTTLES DRAWN AEROBIC AND ANAEROBIC Blood Culture adequate volume Performed at [facility], [address] [place], [place] [address]    Culture  Setup Time   Final    GRAM POSITIVE COCCI AEROBIC BOTTLE ONLY CRITICAL RESULT CALLED TO, READ BACK BY AND VERIFIED WITH: [facility] NAZARI AT [id number] 12/05/2018.PMF Performed at [facility], [place] [address] [place], [place] [address]    Culture STAPHYLOCOCCUS CAPITIS (A)  Final   Report Status 12/08/2018 FINAL  Final   Organism ID, Bacteria STAPHYLOCOCCUS CAPITIS  Final      Susceptibility   Staphylococcus capitis - MIC*    CIPROFLOXACIN <=0.5 SENSITIVE Sensitive     ERYTHROMYCIN <=0.25 SENSITIVE Sensitive     GENTAMICIN <=0.5 SENSITIVE Sensitive     OXACILLIN <=0.25 SENSITIVE Sensitive     TETRACYCLINE <=1 SENSITIVE Sensitive     VANCOMYCIN 1 SENSITIVE Sensitive     TRIMETH/SULFA <=10 SENSITIVE Sensitive     CLINDAMYCIN <=0.25 SENSITIVE Sensitive     RIFAMPIN <=0.5 SENSITIVE Sensitive     Inducible Clindamycin NEGATIVE Sensitive     * STAPHYLOCOCCUS CAPITIS  Blood Culture ID Panel (Reflexed)     Status: Abnormal   Collection Time: 11/11/2018 11:10 PM  Result Value Ref Range Status   Enterococcus species NOT DETECTED NOT DETECTED Final   Listeria monocytogenes NOT DETECTED NOT DETECTED Final   Staphylococcus species DETECTED (A) NOT DETECTED Final    Comment: Methicillin (oxacillin) susceptible coagulase negative staphylococcus. Possible blood culture contaminant (unless isolated from more than one blood culture draw or clinical case suggests pathogenicity). No antibiotic treatment is indicated for blood  culture contaminants. CRITICAL RESULT CALLED TO, READ BACK BY AND VERIFIED WITH: Rito Ehrlich AT [id number] 12/05/2018.PMF    Staphylococcus aureus (BCID) NOT DETECTED NOT DETECTED Final   Methicillin resistance NOT DETECTED NOT DETECTED Final   Streptococcus  species NOT DETECTED NOT DETECTED Final   Streptococcus agalactiae NOT DETECTED NOT DETECTED Final   Streptococcus pneumoniae NOT DETECTED NOT DETECTED Final   Streptococcus pyogenes NOT DETECTED NOT DETECTED Final   Acinetobacter baumannii NOT DETECTED NOT DETECTED Final   Enterobacteriaceae species NOT DETECTED NOT DETECTED Final   Enterobacter cloacae complex NOT DETECTED NOT DETECTED Final   Escherichia coli NOT DETECTED NOT DETECTED Final   Klebsiella oxytoca  NOT DETECTED NOT DETECTED Final   Klebsiella pneumoniae NOT DETECTED NOT DETECTED Final   Proteus species NOT DETECTED NOT DETECTED Final   Serratia marcescens NOT DETECTED NOT DETECTED Final   Haemophilus influenzae NOT DETECTED NOT DETECTED Final   Neisseria meningitidis NOT DETECTED NOT DETECTED Final   Pseudomonas aeruginosa NOT DETECTED NOT DETECTED Final   Candida albicans NOT DETECTED NOT DETECTED Final   Candida glabrata NOT DETECTED NOT DETECTED Final   Candida krusei NOT DETECTED NOT DETECTED Final   Candida parapsilosis NOT DETECTED NOT DETECTED Final   Candida tropicalis NOT DETECTED NOT DETECTED Final    Comment: Performed at [facility], [address], [place], [place] [address]      Studies: No results found.  Scheduled Meds:  chlorhexidine  15 mL Mouth Rinse BID   Chlorhexidine Gluconate Cloth  6 each Topical Daily   collagenase   Topical Daily   mouth rinse  15 mL Mouth Rinse q12n4p   morphine CONCENTRATE  5 mg Sublingual Q6H    Continuous Infusions:    LOS: 5 days     Annita Brod, MD [facility]  To reach me or the doctor on call, go to: [email or web address] Password [facility]  12/10/2018, 4:22 PM

## 2018-12-11 MED ORDER — BLISTEX MEDICATED EX OINT
1.0000 "application " | TOPICAL_OINTMENT | CUTANEOUS | Status: DC | PRN
  Filled 2018-12-11: qty 6.3

## 2019-01-04 NOTE — Progress Notes (Addendum)
Entered room during rounds, noted pt without respirations and pulse, verified by Jaymes Graff RN, notified friend Laverna Peace, states that noone will be here to see pt, ok for pt to go to morgue and that she will notify family, pt to go to [employer] and funeral service in [place] per Abby Potash NP aware

## 2019-01-04 NOTE — Progress Notes (Signed)
PROGRESS NOTE  Sandra Maddox [id number] DOB: 03/21/1950 DOA: 11/21/2018 PCP: Patient, No Pcp Per  HPI/Recap of past 24 hours: Patient is a 68 year old female with past medical history of end-stage Parkinson's disease, Crohn's disease, hepatitis C and suspected dementia with acute encephalopathy admitted on 10/31.  During her hospitalization, work-up revealed that she had extensive metastatic disease of unknown primary.  Communication has been with a close friend who periodically checks on her.  Her extensive family lives in [place].  Patient is unable to take care of herself and lives alone.  Palliative care has been consulted and looking at comfort care.  Patient's nutritional status has been very poor and not felt to be a good candidate for a feeding tube.  Electrolyte abnormalities replaced during her hospitalization.  She has grown 3/4 bottles of staph capitis, pansensitive.  Seen by speech therapist placed on the night she barely takes in, really comfort feeds at this point.  After discussions with palliative care, patient's family, who live out of state, agree that her close friend and neighbor, Sandra Maddox, should make her medical decisions for her.  After discussion with Sandra Maddox, plan is for patient to go to hospice facility.  Comfort care initiated.  Hospice bed pending  Assessment/Plan: Principal Problem:   Altered mental status/acute metabolic encephalopathy: Felt to be secondary to metastatic disease of unknown primary and malnourishment. Active Problems:   Hypercalcemia/hypokalemia/hypophosphatemia: Risk for refeeding syndrome.  Now that she is comfort care, stop checking lab work    Chronic hepatitis C without hepatic coma ([place]): Following transaminases.  Stable at this time.    Community acquired pneumonia    Parkinson disease [facility]): End-stage.  She is to be on Sinemet.  Given her inability to really tolerate p.o., will look at comfort care    Pressure injury of skin:  Wounds are as follows: Present on admission. Pressure Injury 12/05/18 Sacrum Unstageable - Full thickness tissue loss in which the base of the ulcer is covered by slough (yellow, tan, gray, green or brown) and/or eschar (tan, brown or black) in the wound bed. (Active)  12/05/18 2130  Location: Sacrum  Location Orientation:   Staging: Unstageable - Full thickness tissue loss in which the base of the ulcer is covered by slough (yellow, tan, gray, green or brown) and/or eschar (tan, brown or black) in the wound bed.  Wound Description (Comments):   Present on Admission: Yes     Pressure Injury 12/05/18 Hip Right;Left Stage II -  Partial thickness loss of dermis presenting as a shallow open ulcer with a red, pink wound bed without slough. (Active)  12/05/18 2130  Location: Hip  Location Orientation: Right;Left  Staging: Stage II -  Partial thickness loss of dermis presenting as a shallow open ulcer with a red, pink wound bed without slough.  Wound Description (Comments):   Present on Admission: Yes     Pressure Injury 12/05/18 Buttocks Left Stage II -  Partial thickness loss of dermis presenting as a shallow open ulcer with a red, pink wound bed without slough. (Active)  12/05/18 2130  Location: Buttocks  Location Orientation: Left  Staging: Stage II -  Partial thickness loss of dermis presenting as a shallow open ulcer with a red, pink wound bed without slough.  Wound Description (Comments):   Present on Admission: Yes   Appreciate wound care nurse help.  Staph capitis bacteremia: Pansensitive.  Initially changed broad-spectrum antibiotics over to Ancef.  Discontinued when she was made comfort care.  Hypoalbuminemia/protein-calorie malnutrition, severe: Patient meets criteria related to chronic illness of her Parkinson's and Alzheimer's disease as evidenced by severe fat and muscle depletion.  She is on comfort feeds only, but eats very little.  She would not be a candidate for a PEG tube  given aspiration risks as well as likely not even tolerating the procedure to place it.  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  Patient's comorbidities such as end-stage Parkinson's and critically ill state limit any type of biopsy or further work-up.  Appreciate palliative care intervention.  Patient will go to hospice facility  Code Status: DNR  Family Communication: Spoke with Sandra Maddox on 11/5  Disposition Plan: Hospice facility when bed available   Consultants:  Palliative care  Procedures:  None  Antimicrobials:  IV Unasyn 11/2-11/4  IV Ancef 11/5-11/6  DVT prophylaxis: None-comfort care   Objective: Vitals:   12/09/18 1953 12/10/18 1649  BP: 98/75 121/60  Pulse: (!) 104 93  Resp: (!) 22   Temp: 99.1 F (37.3 C)   SpO2: (!) 88%     Intake/Output Summary (Last 24 hours) at 19-Dec-2018 1425 Last data filed at 19-Dec-2018 0900 Gross per 24 hour  Intake 0 ml  Output --  Net 0 ml   Filed Weights   11/09/2018 2114 12/05/18 2044 12/09/18 0514  Weight: 30.1 kg 32.5 kg 36.3 kg   Body mass index is 14.63 kg/m.  Exam: No change  General: Emaciated, resting comfortably  Cardiovascular: Regular rate and rhythm, S1-S2  Respiratory: Limited inspiratory effort, scattered rails   Data Reviewed: CBC: Recent Labs  Lab 11/13/2018 2014 12/05/18 1032 12/06/18 0454 12/07/18 0434 12/08/18 0516 12/09/18 0455  WBC 16.2* 15.3* 15.3* 16.8* 16.2* 14.4*  NEUTROABS 14.2*  --   --   --   --   --   HGB 14.2 13.6 11.6* 11.0* 11.2* 11.8*  HCT 43.4 42.3 35.3* 32.4* 33.2* 35.8*  MCV 105.9* 106.8* 105.7* 101.6* 99.7 102.9*  PLT 194 156 153 133* 131* [id number]*   Basic Metabolic Panel: Recent Labs  Lab 12/05/18 1618 12/06/18 0454 12/07/18 0434 12/08/18 0516 12/09/18 0455  NA 138 144 147* 145 145  K 3.7 2.4* 2.9* 2.7* 3.3*  CL 104 108 112* 105 107  CO2 17* 18* 14* 22 23  GLUCOSE 61* 69* 55* 120* 144*  BUN 19 14 8  <5* <5*  CREATININE 0.46 0.37*  0.40* 0.35* <0.30*  CALCIUM 11.2* 10.7* 10.1 8.9 8.0*  MG  --  1.5* 1.6* 1.4*  --   PHOS  --   --  1.1* 2.0*  --    GFR: CrCl cannot be calculated (This lab value cannot be used to calculate CrCl because it is not a number: <0.30). Liver Function Tests: Recent Labs  Lab 12/05/18 1032 12/06/18 0454 12/07/18 0434 12/08/18 0516 12/09/18 0455  AST 48* 49* 65* 73* 78*  ALT 8 13 26  33 34  ALKPHOS 115 90 101 104 99  BILITOT 1.6* 1.4* 1.6* 1.3* 1.4*  PROT 6.6 5.5* 5.1* 5.2* 5.5*  ALBUMIN 2.7* 2.2* 2.0* 1.9* 2.0*   No results for input(s): LIPASE, AMYLASE in the last 168 hours. No results for input(s): AMMONIA in the last 168 hours. Coagulation Profile: No results for input(s): INR, PROTIME in the last 168 hours. Cardiac Enzymes: Recent Labs  Lab 11/06/2018 2014  CKTOTAL 49   BNP (last 3 results) No results for input(s): PROBNP in the last 8760 hours. HbA1C: No results for input(s): HGBA1C in the last 72  hours. CBG: No results for input(s): GLUCAP in the last 168 hours. Lipid Profile: No results for input(s): CHOL, HDL, LDLCALC, TRIG, CHOLHDL, LDLDIRECT in the last 72 hours. Thyroid Function Tests: No results for input(s): TSH, T4TOTAL, FREET4, T3FREE, THYROIDAB in the last 72 hours. Anemia Panel: No results for input(s): VITAMINB12, FOLATE, FERRITIN, TIBC, IRON, RETICCTPCT in the last 72 hours. Urine analysis: No results found for: COLORURINE, APPEARANCEUR, LABSPEC, PHURINE, GLUCOSEU, HGBUR, BILIRUBINUR, KETONESUR, PROTEINUR, UROBILINOGEN, NITRITE, LEUKOCYTESUR Sepsis Labs: @LABRCNTIP (procalcitonin:4,lacticidven:4)  ) Recent Results (from the past 240 hour(s))  SARS Coronavirus 2 by RT PCR (hospital order, performed in [facility] hospital lab) Nasopharyngeal Nasopharyngeal Swab     Status: None   Collection Time: 11/06/2018  8:20 PM   Specimen: Nasopharyngeal Swab  Result Value Ref Range Status   SARS Coronavirus 2 NEGATIVE NEGATIVE Final    Comment: (NOTE) If result is  NEGATIVE SARS-CoV-2 target nucleic acids are NOT DETECTED. The SARS-CoV-2 RNA is generally detectable in upper and lower  respiratory specimens during the acute phase of infection. The lowest  concentration of SARS-CoV-2 viral copies this assay can detect is 250  copies / mL. A negative result does not preclude SARS-CoV-2 infection  and should not be used as the sole basis for treatment or other  patient management decisions.  A negative result may occur with  improper specimen collection / handling, submission of specimen other  than nasopharyngeal swab, presence of viral mutation(s) within the  areas targeted by this assay, and inadequate number of viral copies  (<250 copies / mL). A negative result must be combined with clinical  observations, patient history, and epidemiological information. If result is POSITIVE SARS-CoV-2 target nucleic acids are DETECTED. The SARS-CoV-2 RNA is generally detectable in upper and lower  respiratory specimens dur ing the acute phase of infection.  Positive  results are indicative of active infection with SARS-CoV-2.  Clinical  correlation with patient history and other diagnostic information is  necessary to determine patient infection status.  Positive results do  not rule out bacterial infection or co-infection with other viruses. If result is PRESUMPTIVE POSTIVE SARS-CoV-2 nucleic acids MAY BE PRESENT.   A presumptive positive result was obtained on the submitted specimen  and confirmed on repeat testing.  While 2019 novel coronavirus  (SARS-CoV-2) nucleic acids may be present in the submitted sample  additional confirmatory testing may be necessary for epidemiological  and / or clinical management purposes  to differentiate between  SARS-CoV-2 and other Sarbecovirus currently known to infect humans.  If clinically indicated additional testing with an alternate test  methodology [phone]) is advised. The SARS-CoV-2 RNA is generally  detectable  in upper and lower respiratory sp ecimens during the acute  phase of infection. The expected result is Negative. Fact Sheet for Patients:  StrictlyIdeas.no Fact Sheet for Healthcare Providers: BankingDealers.co.za This test is not yet approved or cleared by the [place] FDA and has been authorized for detection and/or diagnosis of SARS-CoV-2 by FDA under an Emergency Use Authorization (EUA).  This EUA will remain in effect (meaning this test can be used) for the duration of the COVID-19 declaration under Section 564(b)(1) of the Act, 21 U.S.C. section 360bbb-3(b)(1), unless the authorization is terminated or revoked sooner. Performed at [facility], [address] [place], [place] [address]   Culture, blood (routine x 2)     Status: Abnormal   Collection Time: 12/02/2018 11:08 PM   Specimen: BLOOD  Result Value Ref Range Status  Specimen Description   Final    BLOOD LEFT ANTECUBITAL Performed at [facility], [address] [place], [place] [address]    Special Requests   Final    BOTTLES DRAWN AEROBIC AND ANAEROBIC Blood Culture adequate volume Performed at [facility], [address] [place], [place] [address]    Culture  Setup Time   Final    GRAM POSITIVE COCCI IN BOTH AEROBIC AND ANAEROBIC BOTTLES CRITICAL RESULT CALLED TO, READ BACK BY AND VERIFIED WITH: CHRIS [facility] AT [id number] 12/05/2018.PMF Performed at [facility], [address] [place], [place] [address]    Culture (A)  Final    STAPHYLOCOCCUS CAPITIS SUSCEPTIBILITIES PERFORMED ON PREVIOUS CULTURE WITHIN THE LAST 5 DAYS. Performed at [facility], [place] [address], [place], [place] [address]    Report Status 12/08/2018 FINAL  Final  Culture, blood (routine x 2)     Status: Abnormal   Collection Time: 11/13/2018 11:10 PM   Specimen: BLOOD  Result Value Ref Range Status   Specimen Description   Final    BLOOD RIGHT  ANTECUBITAL Performed at [facility], [address], [place], [place] [address]    Special Requests   Final    BOTTLES DRAWN AEROBIC AND ANAEROBIC Blood Culture adequate volume Performed at [facility], [address] [place], [place] [address]    Culture  Setup Time   Final    GRAM POSITIVE COCCI AEROBIC BOTTLE ONLY CRITICAL RESULT CALLED TO, READ BACK BY AND VERIFIED WITH: [facility] NAZARI AT [id number] 12/05/2018.PMF Performed at [facility], [place] [address] [place], [place] [address]    Culture STAPHYLOCOCCUS CAPITIS (A)  Final   Report Status 12/08/2018 FINAL  Final   Organism ID, Bacteria STAPHYLOCOCCUS CAPITIS  Final      Susceptibility   Staphylococcus capitis - MIC*    CIPROFLOXACIN <=0.5 SENSITIVE Sensitive     ERYTHROMYCIN <=0.25 SENSITIVE Sensitive     GENTAMICIN <=0.5 SENSITIVE Sensitive     OXACILLIN <=0.25 SENSITIVE Sensitive     TETRACYCLINE <=1 SENSITIVE Sensitive     VANCOMYCIN 1 SENSITIVE Sensitive     TRIMETH/SULFA <=10 SENSITIVE Sensitive     CLINDAMYCIN <=0.25 SENSITIVE Sensitive     RIFAMPIN <=0.5 SENSITIVE Sensitive     Inducible Clindamycin NEGATIVE Sensitive     * STAPHYLOCOCCUS CAPITIS  Blood Culture ID Panel (Reflexed)     Status: Abnormal   Collection Time: 11/24/2018 11:10 PM  Result Value Ref Range Status   Enterococcus species NOT DETECTED NOT DETECTED Final   Listeria monocytogenes NOT DETECTED NOT DETECTED Final   Staphylococcus species DETECTED (A) NOT DETECTED Final    Comment: Methicillin (oxacillin) susceptible coagulase negative staphylococcus. Possible blood culture contaminant (unless isolated from more than one blood culture draw or clinical case suggests pathogenicity). No antibiotic treatment is indicated for blood  culture contaminants. CRITICAL RESULT CALLED TO, READ BACK BY AND VERIFIED WITH: Rito Ehrlich AT [id number] 12/05/2018.PMF    Staphylococcus aureus (BCID) NOT DETECTED NOT DETECTED Final   Methicillin  resistance NOT DETECTED NOT DETECTED Final   Streptococcus species NOT DETECTED NOT DETECTED Final   Streptococcus agalactiae NOT DETECTED NOT DETECTED Final   Streptococcus pneumoniae NOT DETECTED NOT DETECTED Final   Streptococcus pyogenes NOT DETECTED NOT DETECTED Final   Acinetobacter baumannii NOT DETECTED NOT DETECTED Final   Enterobacteriaceae species NOT DETECTED NOT DETECTED Final   Enterobacter cloacae complex NOT DETECTED NOT DETECTED Final   Escherichia coli  NOT DETECTED NOT DETECTED Final   Klebsiella oxytoca NOT DETECTED NOT DETECTED Final   Klebsiella pneumoniae NOT DETECTED NOT DETECTED Final   Proteus species NOT DETECTED NOT DETECTED Final   Serratia marcescens NOT DETECTED NOT DETECTED Final   Haemophilus influenzae NOT DETECTED NOT DETECTED Final   Neisseria meningitidis NOT DETECTED NOT DETECTED Final   Pseudomonas aeruginosa NOT DETECTED NOT DETECTED Final   Candida albicans NOT DETECTED NOT DETECTED Final   Candida glabrata NOT DETECTED NOT DETECTED Final   Candida krusei NOT DETECTED NOT DETECTED Final   Candida parapsilosis NOT DETECTED NOT DETECTED Final   Candida tropicalis NOT DETECTED NOT DETECTED Final    Comment: Performed at [facility], [address], [place], [place] [address]      Studies: No results found.  Scheduled Meds:  chlorhexidine  15 mL Mouth Rinse BID   Chlorhexidine Gluconate Cloth  6 each Topical Daily   collagenase   Topical Daily   mouth rinse  15 mL Mouth Rinse q12n4p   morphine CONCENTRATE  5 mg Sublingual Q6H    Continuous Infusions:    LOS: 6 days     Annita Brod, MD [facility]  To reach me or the doctor on call, go to: [email or web address] Password [email or web address]  01-03-19, 2:25 PM

## 2019-01-04 NOTE — Death Summary Note (Signed)
Death Summary  Sandra Maddox [id number] DOB: November 21, 1950 DOA: 12/15/2018  PCP: Patient, No Pcp Per  Admit date: 2018/12/15 Date of Death: 12-22-2018 Time of Death: 10:20 pm Notification: Sandra Jude, MD  notified of death of 2018/12/23 (left message with practice)   History of present illness:  Sandra Maddox is a 68 y.o. female with a history  of end-stage Parkinson's disease, Crohn's disease, hepatitis C and suspected dementia with acute encephalopathy admitted on 12/15/2022.  During her hospitalization, work-up revealed that she had extensive metastatic disease of unknown primary.  Communication has been with a close friend who periodically checks on her.  Her extensive family lives in [place].  Patient is unable to take care of herself and lives alone.  Palliative care has been consulted and looking at comfort care.  Patient's nutritional status has been very poor and not felt to be a good candidate for a feeding tube.  Electrolyte abnormalities replaced during her hospitalization.  She has grown 3/4 bottles of staph capitis, pansensitive.  Seen by speech therapist placed on the night she barely takes in, really comfort feeds at this point.  After discussions with palliative care, patient's family, who live out of state, agree that her close friend and neighbor, Sandra Maddox, should make her medical decisions for her.  After discussion with Sandra Maddox, plan was for patient to go to hospice facility.  However, before bed was available, the patient passed away comfortably on the night of 2022-12-22.  Final Diagnoses: :   Altered mental status/acute metabolic encephalopathy: Felt to be secondary to metastatic disease of unknown primary and malnourishment.  Active Problems:   Hypercalcemia/hypokalemia/hypophosphatemia: Replaced accordingly, patient was felt to be at high risk for refeeding syndrome.      Chronic hepatitis C without hepatic coma ([place]): Followed transaminases.    Parkinson  disease ([place]): End-stage.    Was on Sinemet.  Discontinue when she was made comfort care    Pressure injury of skin: Wound care was following patient.  Wounds are as follows: Present on admission. Pressure Injury 12/05/18 Sacrum Unstageable - Full thickness tissue loss in which the base of the ulcer is covered by slough (yellow, tan, gray, green or brown) and/or eschar (tan, brown or black) in the wound bed. (Active)  12/05/18 2130  Location: Sacrum  Location Orientation:   Staging: Unstageable - Full thickness tissue loss in which the base of the ulcer is covered by slough (yellow, tan, gray, green or brown) and/or eschar (tan, brown or black) in the wound bed.  Wound Description (Comments):   Present on Admission: Yes     Pressure Injury 12/05/18 Hip Right;Left Stage II -  Partial thickness loss of dermis presenting as a shallow open ulcer with a red, pink wound bed without slough. (Active)  12/05/18 2130  Location: Hip  Location Orientation: Right;Left  Staging: Stage II -  Partial thickness loss of dermis presenting as a shallow open ulcer with a red, pink wound bed without slough.  Wound Description (Comments):   Present on Admission: Yes     Pressure Injury 12/05/18 Buttocks Left Stage II -  Partial thickness loss of dermis presenting as a shallow open ulcer with a red, pink wound bed without slough. (Active)  12/05/18 2130  Location: Buttocks  Location Orientation: Left  Staging: Stage II -  Partial thickness loss of dermis presenting as a shallow open ulcer with a red, pink wound bed without slough.  Wound Description (Comments):   Present on Admission: Yes  Staph capitis bacteremia: Pansensitive.    Initially on antibiotics, discontinued when she was made comfort care.  Hypoalbuminemia/protein-calorie malnutrition, severe: Patient meets criteria related to chronic illness of her Parkinson's and Alzheimer's disease as evidenced by severe fat and muscle depletion.  She was  on comfort feeds only, but ate very little.  She would not be a candidate for a PEG tube given aspiration risks as well as likely not even tolerating the procedure to place it.  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  Patient's comorbidities such as end-stage Parkinson's and critically ill state limited a biopsy.  Following comfort care, further work-up was discontinued.    The results of significant diagnostics from this hospitalization (including imaging, microbiology, ancillary and laboratory) are listed below for reference.    Significant Diagnostic Studies: Ct Head Wo Contrast  Result Date: 11/15/2018 CLINICAL DATA:  Altered level of consciousness. EXAM: CT HEAD WITHOUT CONTRAST TECHNIQUE: Contiguous axial images were obtained from the base of the skull through the vertex without intravenous contrast. COMPARISON:  None. FINDINGS: Brain: No evidence of acute infarction, hemorrhage, hydrocephalus, extra-axial collection or mass lesion/mass effect. There is atrophy greater than expected for the patient's age. Vascular: No hyperdense vessel or unexpected calcification. Skull: Normal. Negative for fracture or focal lesion. Sinuses/Orbits: No acute finding. Other: None. IMPRESSION: No acute intracranial abnormality. Electronically Signed   By: Sandra Maddox M.D.   On: 11/19/2018 22:39   Ct Chest Wo Contrast  Result Date: 11/08/2018 CLINICAL DATA:  Unintended weight loss, sacral ulcer, nonlocalizing abdominal pain, shortness of breath possible pneumonia EXAM: CT CHEST, ABDOMEN AND PELVIS WITHOUT CONTRAST TECHNIQUE: Multidetector CT imaging of the chest, abdomen and pelvis was performed following the standard protocol without IV contrast. Sagittal and coronal MPR images reconstructed from axial data set. No oral or IV contrast administered. COMPARISON:  Chest radiograph 11/25/2018 FINDINGS: CT CHEST FINDINGS Cardiovascular: Atherosclerotic calcifications aorta, proximal  great vessels, minimally in coronary arteries. Heart normal size. No pericardial effusion. Mediastinum/Nodes: Esophagus unremarkable. Base of cervical region normal appearance. No thoracic adenopathy. Lungs/Pleura: Emphysematous changes and central peribronchial thickening consistent with COPD. Large soft tissue mass identified at the posteromedial RIGHT hemithorax, 9.2 x 5.9 x 10.0 cm in size, invading the posterior chest wall over a long segment as well as invading 4 vertebral segments of the thoracic spine. Mass enters the spinal canal and causes significant narrowing and compression of the thecal sac, clinically consider cord compression. Mild scattered interstitial changes in the lower lobes bilaterally greater on RIGHT, minimally in the anterolateral LEFT upper lobe. Minimal scarring at the anterolateral periphery of the RIGHT middle lobe. No additional infiltrate, pleural effusion or pneumothorax. Musculoskeletal: Bone destruction of the RIGHT seventh eighth ninth and tenth vertebral bodies along the RIGHT side extending across the midline at the seventh eighth and ninth vertebra. Marked compression deformity of the T8 vertebral body. Mild superior endplate compression fracture of the superior endplate of T9. Bone destruction is also seen extending into the posterior elements at T7 and T8 as well as the RIGHT transverse processes of T6, T7, T8, T9. 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 CT ABDOMEN PELVIS FINDINGS Hepatobiliary: Gallbladder mildly distended but otherwise unremarkable. No biliary dilatation. Poorly defined mass at anterolateral liver likely metastatic lesion 4.2 x 2.7 cm image 17. Pancreas: Mildly atrophic pancreas without mass Spleen: Normal appearance Adrenals/Urinary Tract: Adrenal glands normal appearance. Tiny nonobstructing LEFT renal calculus. Kidneys, ureters, and bladder normal appearance Stomach/Bowel: Markedly increased stool in  rectum. Stomach  decompressed. Unable to exclude wall thickening of the distal gastric antrum/pyloric region. Remaining bowel loops unremarkable. Vascular/Lymphatic: Atherosclerotic calcifications aorta and extensively and iliac arteries without aneurysm. Significant thrombus in the distal abdominal aorta markedly narrowing the lumen. No adenopathy. Reproductive: Significant fluid distends the upper endometrial canal with thinning of the myometrium. Atrophic ovaries. Other: No free air free fluid. No hernia. Musculoskeletal: Diffuse osseous demineralization. Compression fractures of inferior L1 and superior L2. Scoliosis and degenerative changes of the lumbar spine. IMPRESSION: Large soft tissue mass at the posteromedial RIGHT hemithorax 9.2 x 5.9 x 10.0 cm in size, invading the posterior chest wall over a long segment, causing destruction of RIGHT seventh through tenth ribs and the RIGHT lateral aspect of the seventh through tenth thoracic vertebra. Extension of tumor into spinal canal causing marked compression and narrowing of the thecal sac, clinically consider cord compression. Suspected metastatic lesion at anterolateral liver 4.2 x 2.7 cm. Markedly increased stool in rectum. Tiny nonobstructing LEFT renal calculus. Significant fluid distends the upper endometrial canal with thinning of the myometrium; this could be assessed by follow-up pelvic sonography, if clinically indicated based on patient condition. Aortic Atherosclerosis (ICD10-I70.0) and Emphysema (ICD10-J43.9). Findings called to Dr. Cinda Quest On 11/08/2018 at 2253 hours. Electronically Signed   By: Lavonia Dana M.D.   On: 11/28/2018 22:57   Ct Abdomen Pelvis W Contrast  Result Date: 12/02/2018 CLINICAL DATA:  Unintended weight loss, sacral ulcer, nonlocalizing abdominal pain, shortness of breath possible pneumonia EXAM: CT CHEST, ABDOMEN AND PELVIS WITHOUT CONTRAST TECHNIQUE: Multidetector CT imaging of the chest, abdomen and pelvis was performed following the  standard protocol without IV contrast. Sagittal and coronal MPR images reconstructed from axial data set. No oral or IV contrast administered. COMPARISON:  Chest radiograph 12/01/2018 FINDINGS: CT CHEST FINDINGS Cardiovascular: Atherosclerotic calcifications aorta, proximal great vessels, minimally in coronary arteries. Heart normal size. No pericardial effusion. Mediastinum/Nodes: Esophagus unremarkable. Base of cervical region normal appearance. No thoracic adenopathy. Lungs/Pleura: Emphysematous changes and central peribronchial thickening consistent with COPD. Large soft tissue mass identified at the posteromedial RIGHT hemithorax, 9.2 x 5.9 x 10.0 cm in size, invading the posterior chest wall over a long segment as well as invading 4 vertebral segments of the thoracic spine. Mass enters the spinal canal and causes significant narrowing and compression of the thecal sac, clinically consider cord compression. Mild scattered interstitial changes in the lower lobes bilaterally greater on RIGHT, minimally in the anterolateral LEFT upper lobe. Minimal scarring at the anterolateral periphery of the RIGHT middle lobe. No additional infiltrate, pleural effusion or pneumothorax. Musculoskeletal: Bone destruction of the RIGHT seventh eighth ninth and tenth vertebral bodies along the RIGHT side extending across the midline at the seventh eighth and ninth vertebra. Marked compression deformity of the T8 vertebral body. Mild superior endplate compression fracture of the superior endplate of T9. Bone destruction is also seen extending into the posterior elements at T7 and T8 as well as the RIGHT transverse processes of T6, T7, T8, T9. 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 CT ABDOMEN PELVIS FINDINGS Hepatobiliary: Gallbladder mildly distended but otherwise unremarkable. No biliary dilatation. Poorly defined mass at anterolateral liver likely metastatic lesion 4.2 x 2.7 cm image 17. Pancreas: Mildly  atrophic pancreas without mass Spleen: Normal appearance Adrenals/Urinary Tract: Adrenal glands normal appearance. Tiny nonobstructing LEFT renal calculus. Kidneys, ureters, and bladder normal appearance Stomach/Bowel: Markedly increased stool in rectum. Stomach decompressed. Unable to exclude wall thickening of the distal gastric antrum/pyloric region.  Remaining bowel loops unremarkable. Vascular/Lymphatic: Atherosclerotic calcifications aorta and extensively and iliac arteries without aneurysm. Significant thrombus in the distal abdominal aorta markedly narrowing the lumen. No adenopathy. Reproductive: Significant fluid distends the upper endometrial canal with thinning of the myometrium. Atrophic ovaries. Other: No free air free fluid. No hernia. Musculoskeletal: Diffuse osseous demineralization. Compression fractures of inferior L1 and superior L2. Scoliosis and degenerative changes of the lumbar spine. IMPRESSION: Large soft tissue mass at the posteromedial RIGHT hemithorax 9.2 x 5.9 x 10.0 cm in size, invading the posterior chest wall over a long segment, causing destruction of RIGHT seventh through tenth ribs and the RIGHT lateral aspect of the seventh through tenth thoracic vertebra. Extension of tumor into spinal canal causing marked compression and narrowing of the thecal sac, clinically consider cord compression. Suspected metastatic lesion at anterolateral liver 4.2 x 2.7 cm. Markedly increased stool in rectum. Tiny nonobstructing LEFT renal calculus. Significant fluid distends the upper endometrial canal with thinning of the myometrium; this could be assessed by follow-up pelvic sonography, if clinically indicated based on patient condition. Aortic Atherosclerosis (ICD10-I70.0) and Emphysema (ICD10-J43.9). Findings called to Dr. Cinda Quest On 11/19/2018 at 2253 hours. Electronically Signed   By: Lavonia Dana M.D.   On: 11/20/2018 22:57   Dg Chest Portable 1 View  Result Date: 11/06/2018 CLINICAL DATA:   Weakness. EXAM: PORTABLE CHEST 1 VIEW COMPARISON:  None. FINDINGS: No pneumothorax. Prominence of the right hilum with perihilar haziness is identified. The heart, left hilum, and mediastinum are normal. No other acute abnormalities are identified. IMPRESSION: Prominence of the right hilum with right perihilar haziness. This could represent infiltrate/pneumonia versus a mass. Recommend a CT scan for further evaluation. Electronically Signed   By: Dorise Bullion III M.D   On: 11/26/2018 21:04    Microbiology: Recent Results (from the past 240 hour(s))  SARS Coronavirus 2 by RT PCR (hospital order, performed in [facility] hospital lab) Nasopharyngeal Nasopharyngeal Swab     Status: None   Collection Time: 11/28/2018  8:20 PM   Specimen: Nasopharyngeal Swab  Result Value Ref Range Status   SARS Coronavirus 2 NEGATIVE NEGATIVE Final    Comment: (NOTE) If result is NEGATIVE SARS-CoV-2 target nucleic acids are NOT DETECTED. The SARS-CoV-2 RNA is generally detectable in upper and lower  respiratory specimens during the acute phase of infection. The lowest  concentration of SARS-CoV-2 viral copies this assay can detect is 250  copies / mL. A negative result does not preclude SARS-CoV-2 infection  and should not be used as the sole basis for treatment or other  patient management decisions.  A negative result may occur with  improper specimen collection / handling, submission of specimen other  than nasopharyngeal swab, presence of viral mutation(s) within the  areas targeted by this assay, and inadequate number of viral copies  (<250 copies / mL). A negative result must be combined with clinical  observations, patient history, and epidemiological information. If result is POSITIVE SARS-CoV-2 target nucleic acids are DETECTED. The SARS-CoV-2 RNA is generally detectable in upper and lower  respiratory specimens dur ing the acute phase of infection.  Positive  results are indicative of active  infection with SARS-CoV-2.  Clinical  correlation with patient history and other diagnostic information is  necessary to determine patient infection status.  Positive results do  not rule out bacterial infection or co-infection with other viruses. If result is PRESUMPTIVE POSTIVE SARS-CoV-2 nucleic acids MAY BE PRESENT.   A presumptive positive result was obtained on  the submitted specimen  and confirmed on repeat testing.  While 2019 novel coronavirus  (SARS-CoV-2) nucleic acids may be present in the submitted sample  additional confirmatory testing may be necessary for epidemiological  and / or clinical management purposes  to differentiate between  SARS-CoV-2 and other Sarbecovirus currently known to infect humans.  If clinically indicated additional testing with an alternate test  methodology [id number]) is advised. The SARS-CoV-2 RNA is generally  detectable in upper and lower respiratory sp ecimens during the acute  phase of infection. The expected result is Negative. Fact Sheet for Patients:  StrictlyIdeas.no Fact Sheet for Healthcare Providers: BankingDealers.co.za This test is not yet approved or cleared by the [place] FDA and has been authorized for detection and/or diagnosis of SARS-CoV-2 by FDA under an Emergency Use Authorization (EUA).  This EUA will remain in effect (meaning this test can be used) for the duration of the COVID-19 declaration under Section 564(b)(1) of the Act, 21 U.S.C. section 360bbb-3(b)(1), unless the authorization is terminated or revoked sooner. Performed at [facility], [address] [place], [place] [address]   Culture, blood (routine x 2)     Status: Abnormal   Collection Time: 11/11/2018 11:08 PM   Specimen: BLOOD  Result Value Ref Range Status   Specimen Description   Final    BLOOD LEFT ANTECUBITAL Performed at [facility], [address], [place], [place]  [address]    Special Requests   Final    BOTTLES DRAWN AEROBIC AND ANAEROBIC Blood Culture adequate volume Performed at [facility], [address], [place], [place] [address]    Culture  Setup Time   Final    GRAM POSITIVE COCCI IN BOTH AEROBIC AND ANAEROBIC BOTTLES CRITICAL RESULT CALLED TO, READ BACK BY AND VERIFIED WITH: [facility] AT [id number] 12/05/2018.PMF Performed at [facility], [address] [place], [place] [address]    Culture (A)  Final    STAPHYLOCOCCUS CAPITIS SUSCEPTIBILITIES PERFORMED ON PREVIOUS CULTURE WITHIN THE LAST 5 DAYS. Performed at [facility], [place] [address] [place], [place] [address]    Report Status 12/08/2018 FINAL  Final  Culture, blood (routine x 2)     Status: Abnormal   Collection Time: 11/07/2018 11:10 PM   Specimen: BLOOD  Result Value Ref Range Status   Specimen Description   Final    BLOOD RIGHT ANTECUBITAL Performed at [facility], [address] [place], [place] [address]    Special Requests   Final    BOTTLES DRAWN AEROBIC AND ANAEROBIC Blood Culture adequate volume Performed at [facility], [address] [place], [place] [address]    Culture  Setup Time   Final    GRAM POSITIVE COCCI AEROBIC BOTTLE ONLY CRITICAL RESULT CALLED TO, READ BACK BY AND VERIFIED WITH: [facility] NAZARI AT [id number] 12/05/2018.PMF Performed at [facility], [place] [address] [place], [place] [address]    Culture STAPHYLOCOCCUS CAPITIS (A)  Final   Report Status 12/08/2018 FINAL  Final   Organism ID, Bacteria STAPHYLOCOCCUS CAPITIS  Final      Susceptibility   Staphylococcus capitis - MIC*    CIPROFLOXACIN <=0.5 SENSITIVE Sensitive     ERYTHROMYCIN <=0.25 SENSITIVE Sensitive     GENTAMICIN <=0.5 SENSITIVE Sensitive     OXACILLIN <=0.25 SENSITIVE Sensitive     TETRACYCLINE <=1 SENSITIVE Sensitive     VANCOMYCIN 1 SENSITIVE Sensitive     TRIMETH/SULFA <=10 SENSITIVE Sensitive  CLINDAMYCIN <=0.25 SENSITIVE  Sensitive     RIFAMPIN <=0.5 SENSITIVE Sensitive     Inducible Clindamycin NEGATIVE Sensitive     * STAPHYLOCOCCUS CAPITIS  Blood Culture ID Panel (Reflexed)     Status: Abnormal   Collection Time: 11/15/2018 11:10 PM  Result Value Ref Range Status   Enterococcus species NOT DETECTED NOT DETECTED Final   Listeria monocytogenes NOT DETECTED NOT DETECTED Final   Staphylococcus species DETECTED (A) NOT DETECTED Final    Comment: Methicillin (oxacillin) susceptible coagulase negative staphylococcus. Possible blood culture contaminant (unless isolated from more than one blood culture draw or clinical case suggests pathogenicity). No antibiotic treatment is indicated for blood  culture contaminants. CRITICAL RESULT CALLED TO, READ BACK BY AND VERIFIED WITH: Rito Ehrlich AT [id number] 12/05/2018.PMF    Staphylococcus aureus (BCID) NOT DETECTED NOT DETECTED Final   Methicillin resistance NOT DETECTED NOT DETECTED Final   Streptococcus species NOT DETECTED NOT DETECTED Final   Streptococcus agalactiae NOT DETECTED NOT DETECTED Final   Streptococcus pneumoniae NOT DETECTED NOT DETECTED Final   Streptococcus pyogenes NOT DETECTED NOT DETECTED Final   Acinetobacter baumannii NOT DETECTED NOT DETECTED Final   Enterobacteriaceae species NOT DETECTED NOT DETECTED Final   Enterobacter cloacae complex NOT DETECTED NOT DETECTED Final   Escherichia coli NOT DETECTED NOT DETECTED Final   Klebsiella oxytoca NOT DETECTED NOT DETECTED Final   Klebsiella pneumoniae NOT DETECTED NOT DETECTED Final   Proteus species NOT DETECTED NOT DETECTED Final   Serratia marcescens NOT DETECTED NOT DETECTED Final   Haemophilus influenzae NOT DETECTED NOT DETECTED Final   Neisseria meningitidis NOT DETECTED NOT DETECTED Final   Pseudomonas aeruginosa NOT DETECTED NOT DETECTED Final   Candida albicans NOT DETECTED NOT DETECTED Final   Candida glabrata NOT DETECTED NOT DETECTED Final   Candida krusei NOT DETECTED NOT DETECTED Final    Candida parapsilosis NOT DETECTED NOT DETECTED Final   Candida tropicalis NOT DETECTED NOT DETECTED Final    Comment: Performed at [facility], [address] [place], [place] [address]     Labs: Basic Metabolic Panel: Recent Labs  Lab 12/05/18 1618 12/06/18 0454 12/07/18 0434 12/08/18 0516 12/09/18 0455  NA 138 144 147* 145 145  K 3.7 2.4* 2.9* 2.7* 3.3*  CL 104 108 112* 105 107  CO2 17* 18* 14* 22 23  GLUCOSE 61* 69* 55* 120* 144*  BUN 19 14 8  <5* <5*  CREATININE 0.46 0.37* 0.40* 0.35* <0.30*  CALCIUM 11.2* 10.7* 10.1 8.9 8.0*  MG  --  1.5* 1.6* 1.4*  --   PHOS  --   --  1.1* 2.0*  --    Liver Function Tests: Recent Labs  Lab 12/06/18 0454 12/07/18 0434 12/08/18 0516 12/09/18 0455  AST 49* 65* 73* 78*  ALT 13 26 33 34  ALKPHOS 90 101 104 99  BILITOT 1.4* 1.6* 1.3* 1.4*  PROT 5.5* 5.1* 5.2* 5.5*  ALBUMIN 2.2* 2.0* 1.9* 2.0*   No results for input(s): LIPASE, AMYLASE in the last 168 hours. No results for input(s): AMMONIA in the last 168 hours. CBC: Recent Labs  Lab 12/06/18 0454 12/07/18 0434 12/08/18 0516 12/09/18 0455  WBC 15.3* 16.8* 16.2* 14.4*  HGB 11.6* 11.0* 11.2* 11.8*  HCT 35.3* 32.4* 33.2* 35.8*  MCV 105.7* 101.6* 99.7 102.9*  PLT 153 133* 131* 127*   Cardiac Enzymes: No results for input(s): CKTOTAL, CKMB, CKMBINDEX, TROPONINI in the last 168 hours. D-Dimer No results for input(s): DDIMER in the last 72 hours.  BNP: Invalid input(s): POCBNP CBG: No results for input(s): GLUCAP in the last 168 hours. Anemia work up No results for input(s): VITAMINB12, FOLATE, FERRITIN, TIBC, IRON, RETICCTPCT in the last 72 hours. Urinalysis No results found for: COLORURINE, APPEARANCEUR, LABSPEC, PHURINE, GLUCOSEU, HGBUR, BILIRUBINUR, KETONESUR, PROTEINUR, UROBILINOGEN, NITRITE, LEUKOCYTESUR Sepsis Labs Invalid input(s): PROCALCITONIN,  WBC,  LACTICIDVEN     SIGNED:  Annita Brod, MD  [facility] 12/12/2018, 10:59  AM Pager   If 7PM-7AM, please contact night-coverage [email or web address] Password [email or web address]

## 2019-01-04 DEATH — deceased

## 2021-06-08 IMAGING — CT CT CHEST W/O CM
2 of 3 series · 14 of 36 positions shown, 17 images · non-contrast
Comparison: Chest radiograph 12/04/2018

CLINICAL DATA: Unintended weight loss, sacral ulcer, nonlocalizing
abdominal pain, shortness of breath possible pneumonia

EXAM:
CT CHEST, ABDOMEN AND PELVIS WITHOUT CONTRAST
TECHNIQUE: Multidetector CT imaging of the chest, abdomen and pelvis was
performed following the standard protocol without IV contrast.
Sagittal and coronal MPR images reconstructed from axial data set.
No oral or IV contrast administered.

[Series 2: thorax · axial · 0.57mm/px · z∈[-478,-268]mm · 11 of 125 slices shown, 14 images]
[im 10/125  mediastinal]
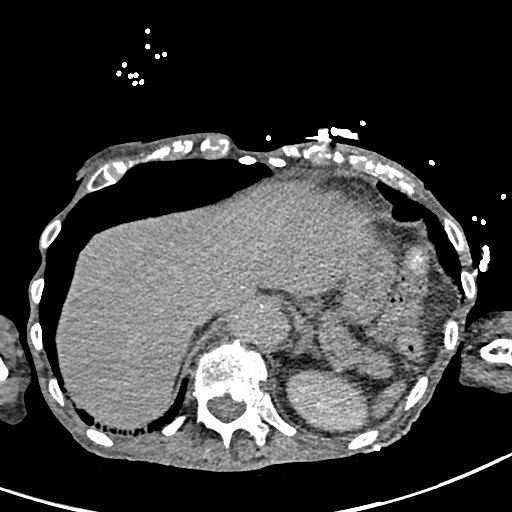
[im 10/125  lung]
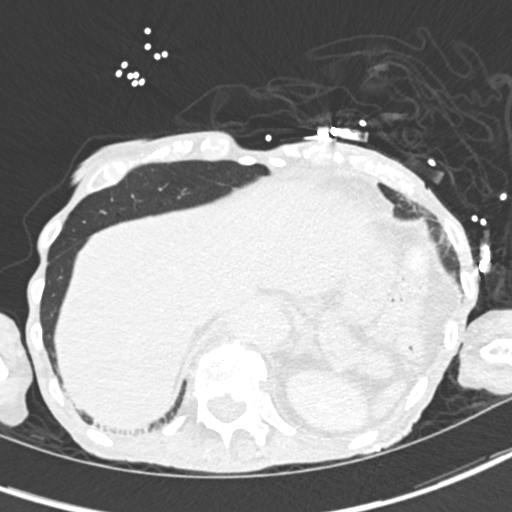
[im 19/125  lung]
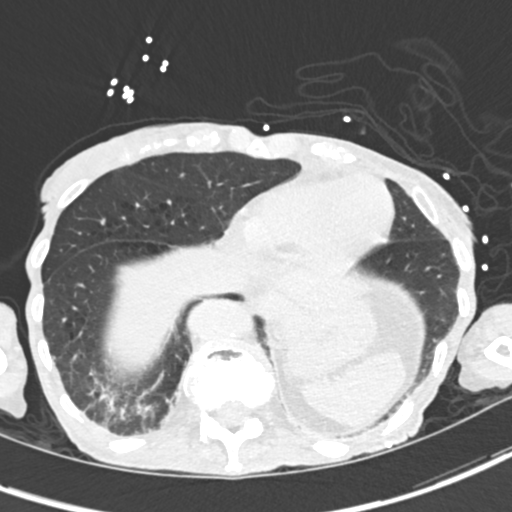
[im 28/125  lung]
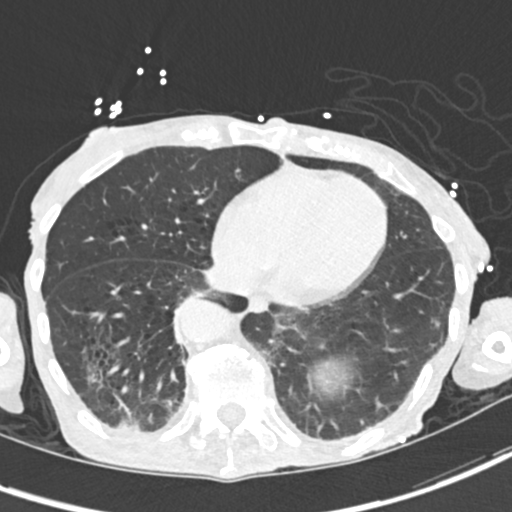
[im 42/125  lung]
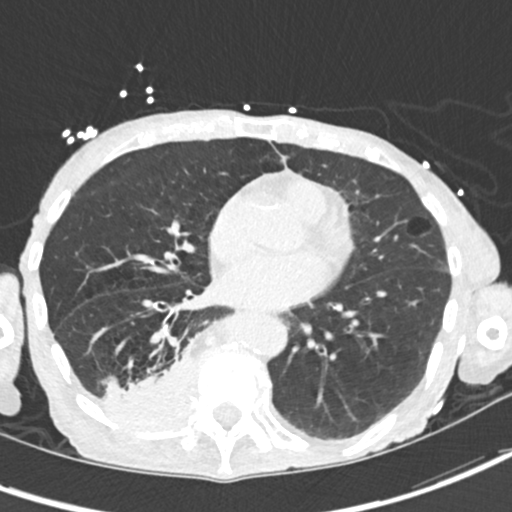
[im 51/125  mediastinal]
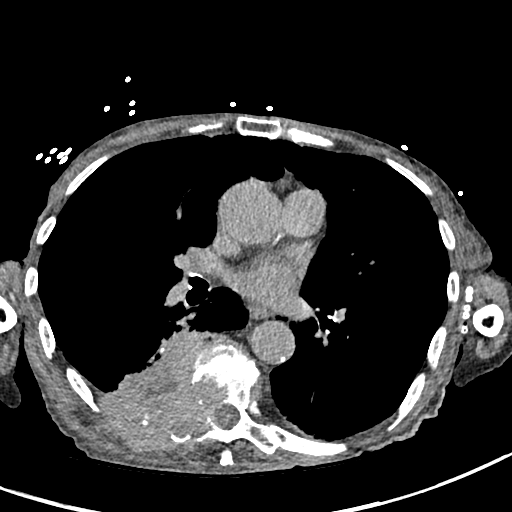
[im 51/125  lung]
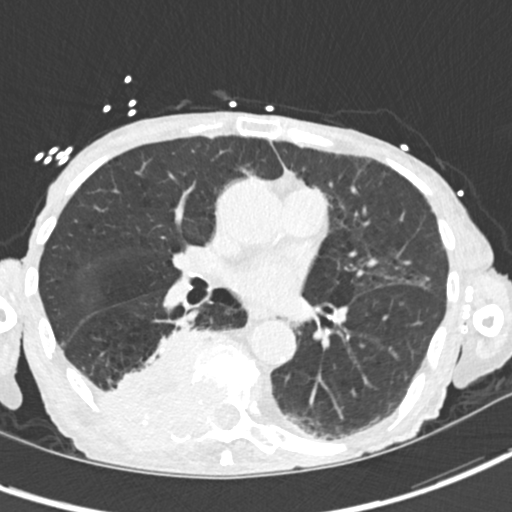
[im 65/125  lung]
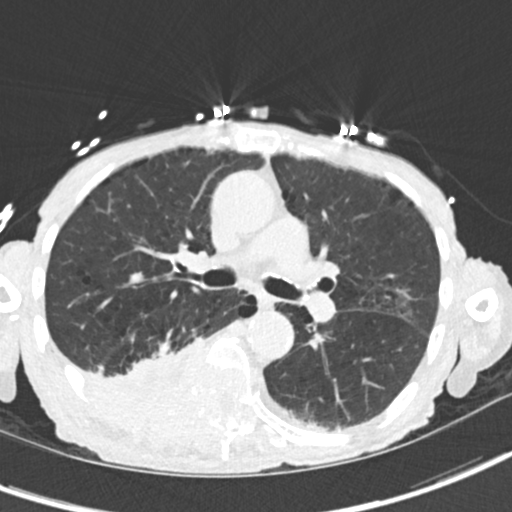
[im 74/125  lung]
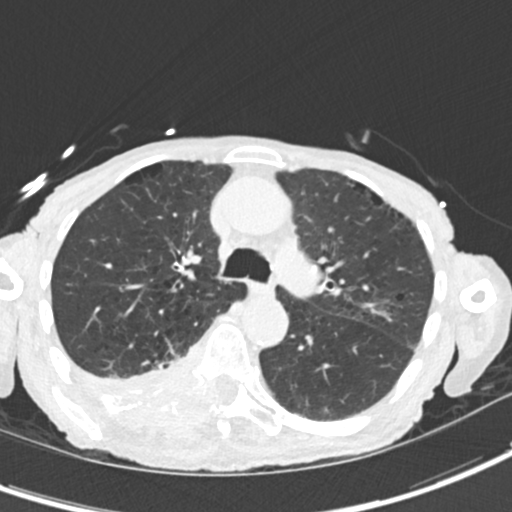
[im 83/125  lung]
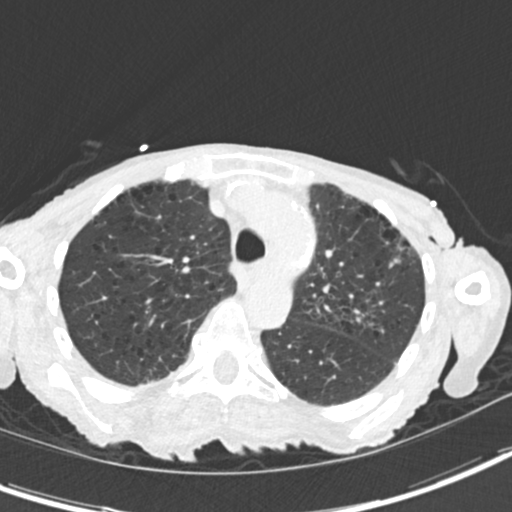
[im 97/125  mediastinal]
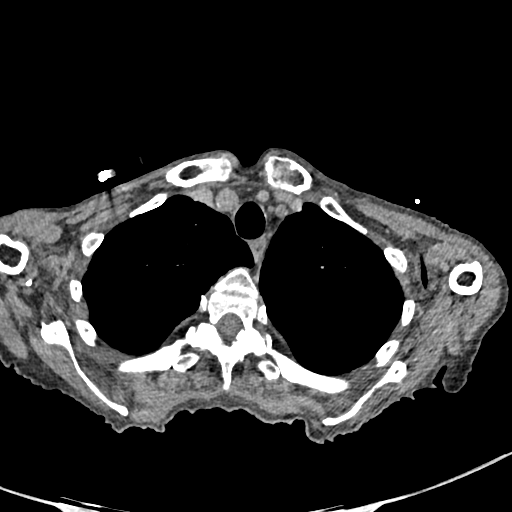
[im 97/125  lung]
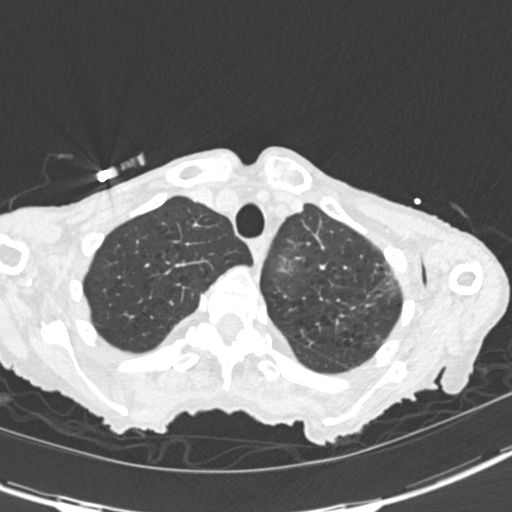
[im 106/125  lung]
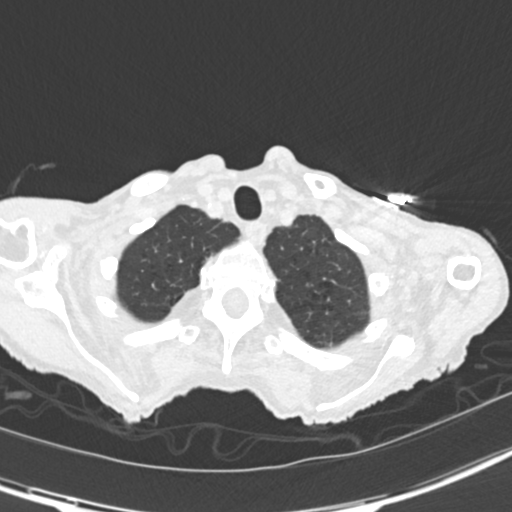
[im 115/125  lung]
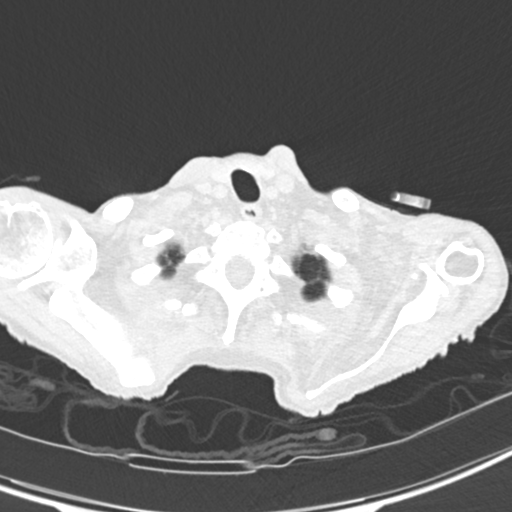

[Series 5: coronal · coronal · 0.51mm/px · 3 of 103 slices shown]
[im 21/103  lung]
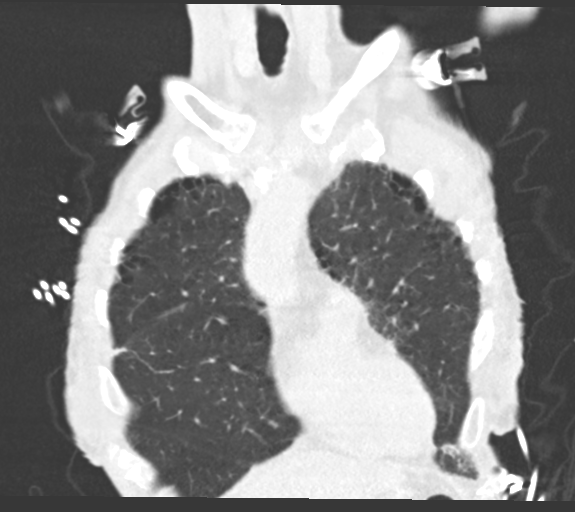
[im 41/103  lung]
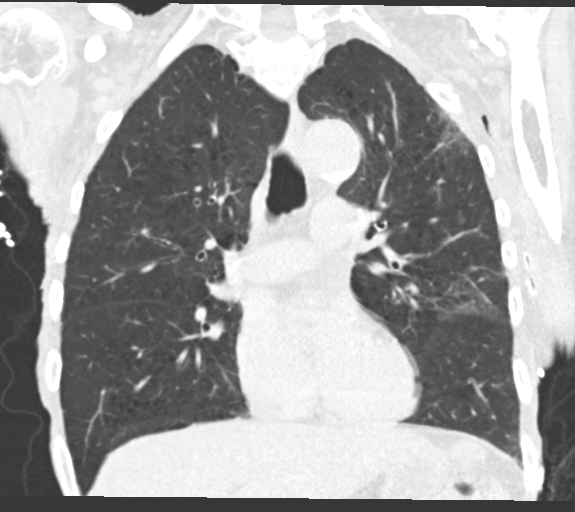
[im 62/103  lung]
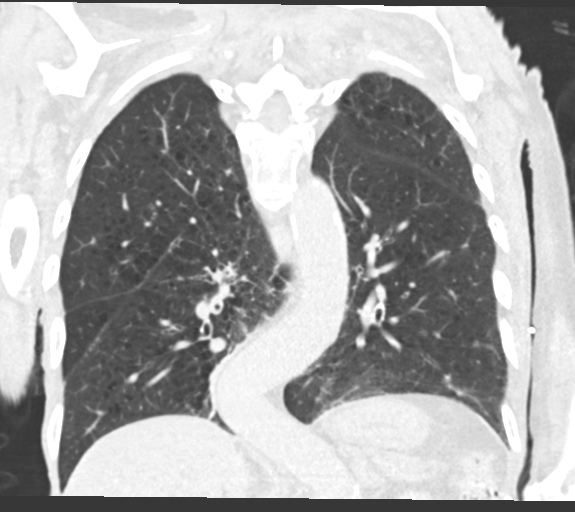

[14 of 36 positions shown; findings below may reference images not displayed]

FINDINGS: CT CHEST FINDINGS

Cardiovascular: Atherosclerotic calcifications aorta, proximal great
vessels, minimally in coronary arteries. Heart normal size. No
pericardial effusion.

Mediastinum/Nodes: Esophagus unremarkable. Base of cervical region
normal appearance. No thoracic adenopathy.

Lungs/Pleura: Emphysematous changes and central peribronchial
thickening consistent with COPD. Large soft tissue mass identified
at the posteromedial RIGHT hemithorax, 9.2 x 5.9 x 10.0 cm in size,
invading the posterior chest wall over a long segment as well as
invading 4 vertebral segments of the thoracic spine. Mass enters the
spinal canal and causes significant narrowing and compression of the
thecal sac, clinically consider cord compression. Mild scattered
interstitial changes in the lower lobes bilaterally greater on
RIGHT, minimally in the anterolateral LEFT upper lobe. Minimal
scarring at the anterolateral periphery of the RIGHT middle lobe. No
additional infiltrate, pleural effusion or pneumothorax.

Musculoskeletal: Bone destruction of the RIGHT seventh eighth ninth
and tenth vertebral bodies along the RIGHT side extending across the
midline at the seventh eighth and ninth vertebra. Marked compression
deformity of the T8 vertebral body. Mild superior endplate
compression fracture of the superior endplate of T9. Bone
destruction is also seen extending into the posterior elements at T7
and T8 as well as the RIGHT transverse processes of T6, T7, T8, T9.
Tumor enters spinal canal causing thecal sac compression as above.

CT ABDOMEN PELVIS FINDINGS

Hepatobiliary: Gallbladder mildly distended but otherwise
unremarkable. No biliary dilatation. Poorly defined mass at
anterolateral liver likely metastatic lesion 4.2 x 2.7 cm image 17.

Pancreas: Mildly atrophic pancreas without mass

Spleen: Normal appearance

Adrenals/Urinary Tract: Adrenal glands normal appearance. Tiny
nonobstructing LEFT renal calculus. Kidneys, ureters, and bladder
normal appearance

Stomach/Bowel: Markedly increased stool in rectum. Stomach
decompressed. Unable to exclude wall thickening of the distal
gastric antrum/pyloric region. Remaining bowel loops unremarkable.

Vascular/Lymphatic: Atherosclerotic calcifications aorta and
extensively and iliac arteries without aneurysm. Significant
thrombus in the distal abdominal aorta markedly narrowing the lumen.
No adenopathy.

Reproductive: Significant fluid distends the upper endometrial canal
with thinning of the myometrium. Atrophic ovaries.

Other: No free air free fluid. No hernia.

Musculoskeletal: Diffuse osseous demineralization. Compression
fractures of inferior L1 and superior L2. Scoliosis and degenerative
changes of the lumbar spine.
IMPRESSION: Large soft tissue mass at the posteromedial RIGHT hemithorax 9.2 x
5.9 x 10.0 cm in size, invading the posterior chest wall over a long
segment, causing destruction of RIGHT seventh through tenth ribs and
the RIGHT lateral aspect of the seventh through tenth thoracic
vertebra.

Extension of tumor into spinal canal causing marked compression and
narrowing of the thecal sac, clinically consider cord compression.

Suspected metastatic lesion at anterolateral liver 4.2 x 2.7 cm.

Markedly increased stool in rectum.

Tiny nonobstructing LEFT renal calculus.

Significant fluid distends the upper endometrial canal with thinning
of the myometrium; this could be assessed by follow-up pelvic
sonography, if clinically indicated based on patient condition.

Aortic Atherosclerosis (VG1IC-21A.A) and Emphysema ([id number].Z).

Findings called to Dr. Alika On 12/04/2018 at [id number] hours.

## 2021-06-08 IMAGING — CT CT HEAD W/O CM
3 of 4 series · 16 of 47 positions shown, 19 images · non-contrast
Comparison: None.

CLINICAL DATA: Altered level of consciousness.

EXAM:
CT HEAD WITHOUT CONTRAST
TECHNIQUE: Contiguous axial images were obtained from the base of the skull
through the vertex without intravenous contrast.

[Series 2: head wo · axial · 0.39mm/px · z∈[-119,+1]mm · 10 of 29 slices shown, 13 images]
[im 3/29  brain]
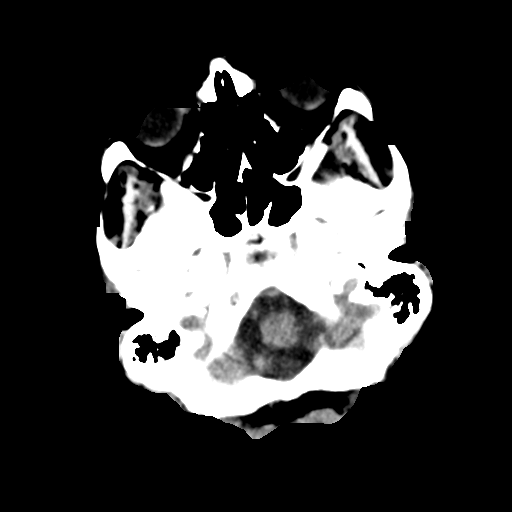
[im 3/29  bone]
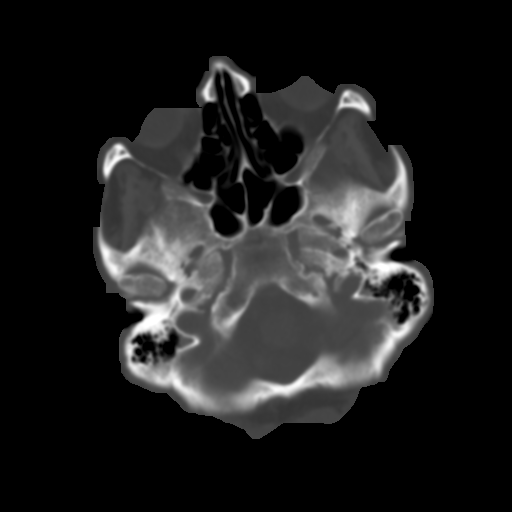
[im 5/29  brain]
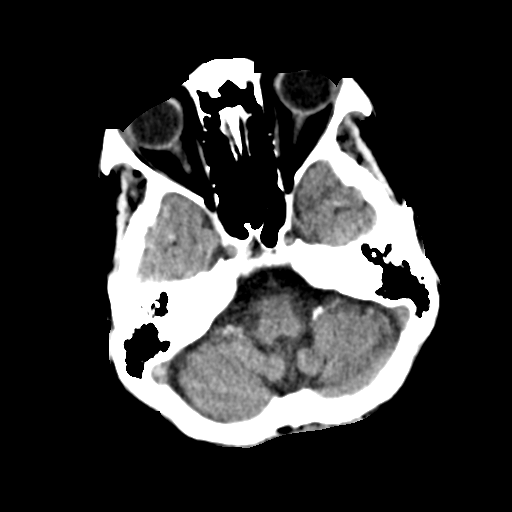
[im 9/29  brain]
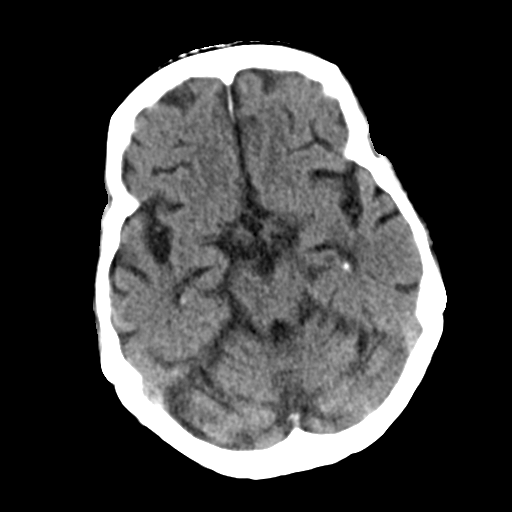
[im 11/29  brain]
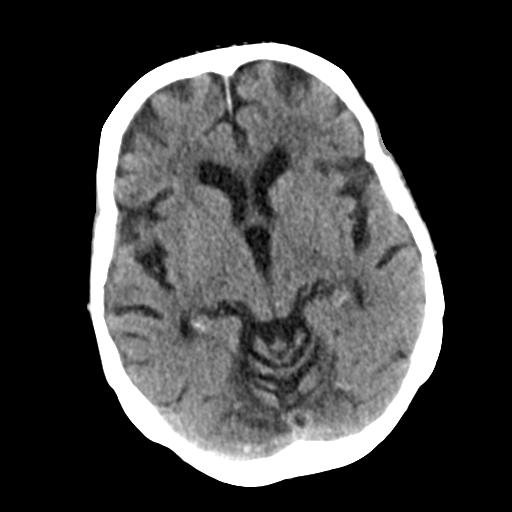
[im 13/29  brain]
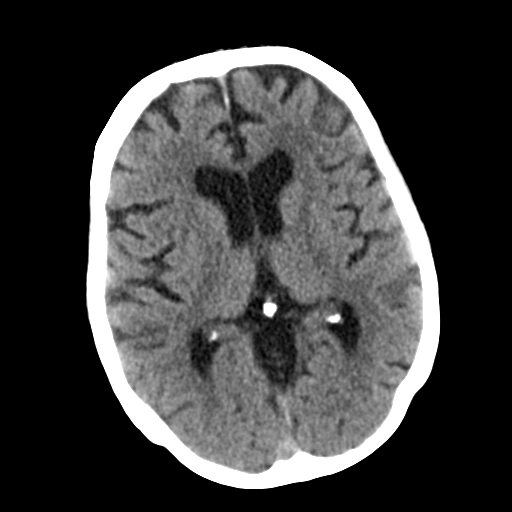
[im 13/29  bone]
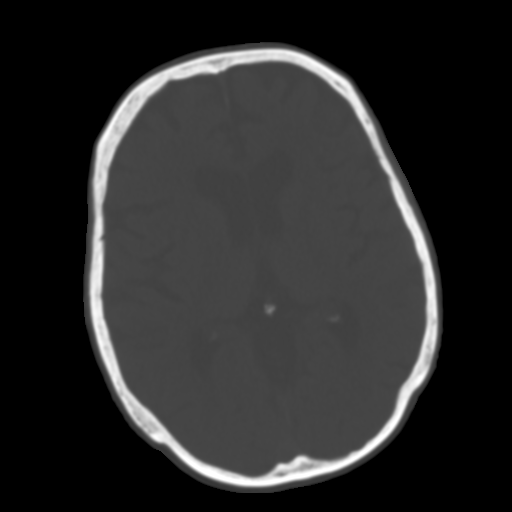
[im 17/29  brain]
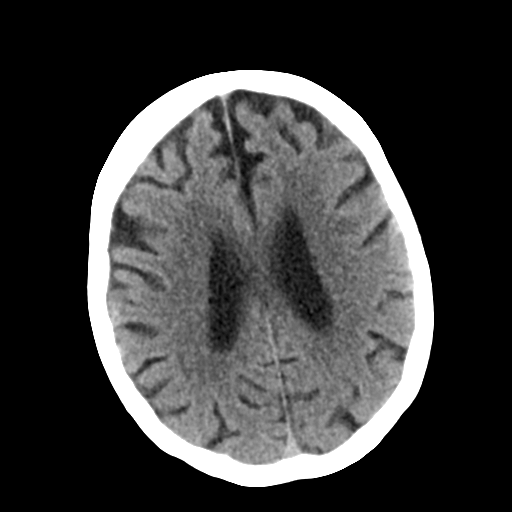
[im 19/29  brain]
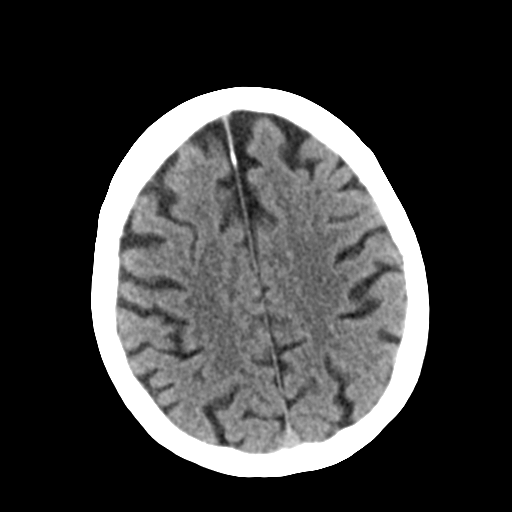
[im 21/29  brain]
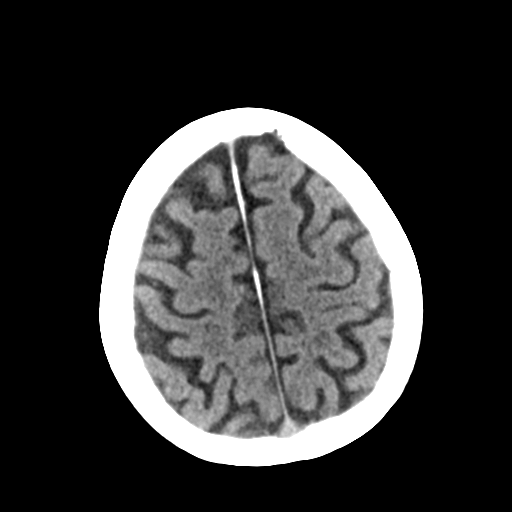
[im 25/29  brain]
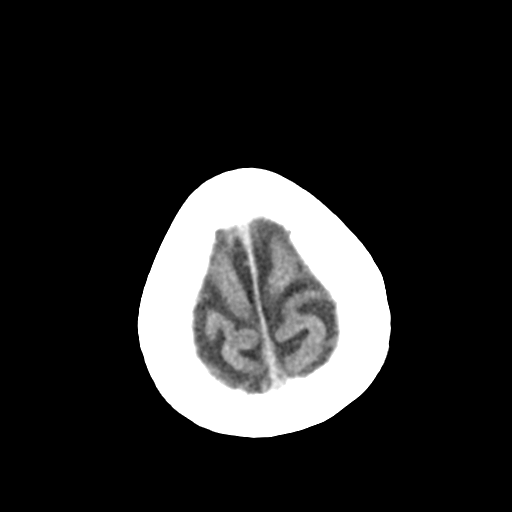
[im 25/29  bone]
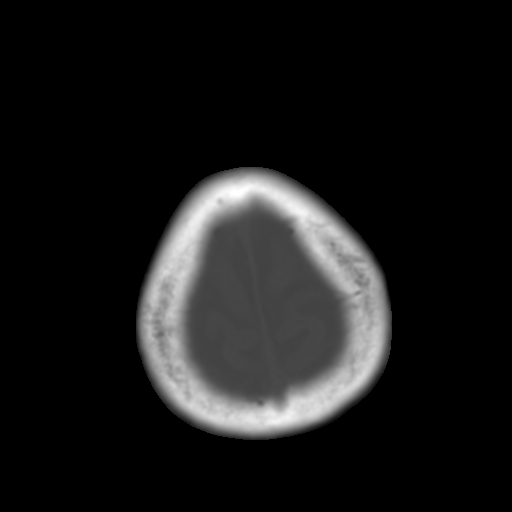
[im 27/29  brain]
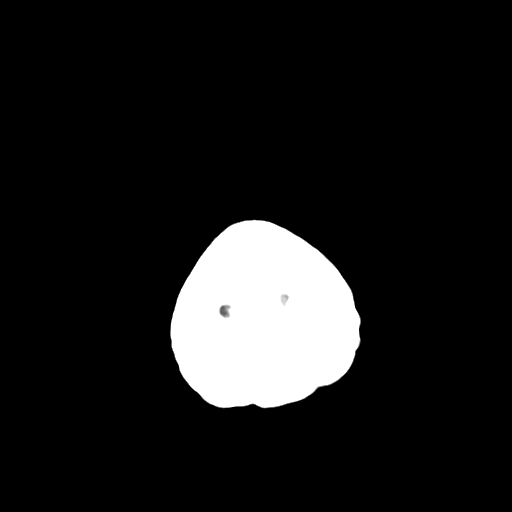

[Series 5: coronal soft tissue · coronal · 0.29mm/px · 3 of 57 slices shown]
[im 19/57  brain]
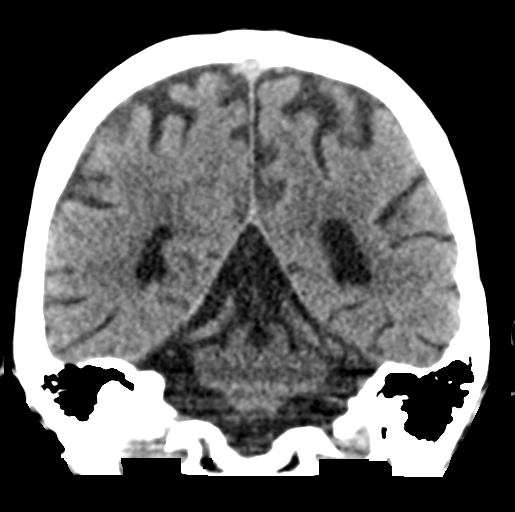
[im 25/57  brain]
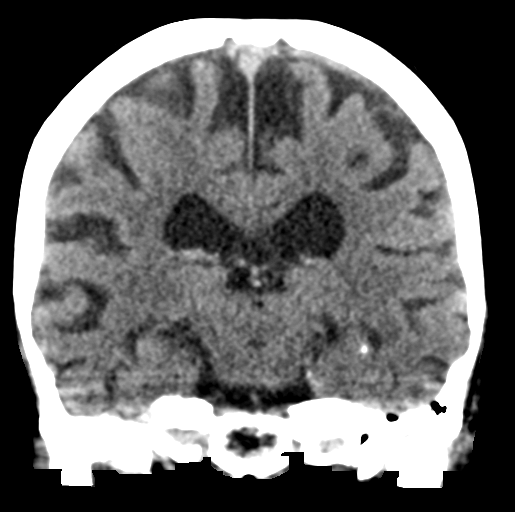
[im 32/57  brain]
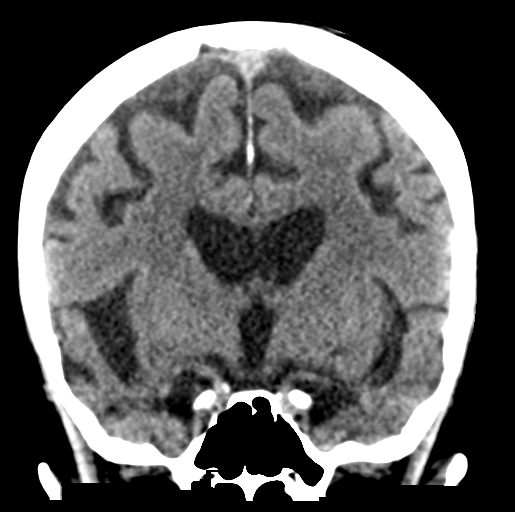

[Series 6: sagittal soft tissue · sagittal · 0.30mm/px · 3 of 46 slices shown]
[im 16/46  brain]
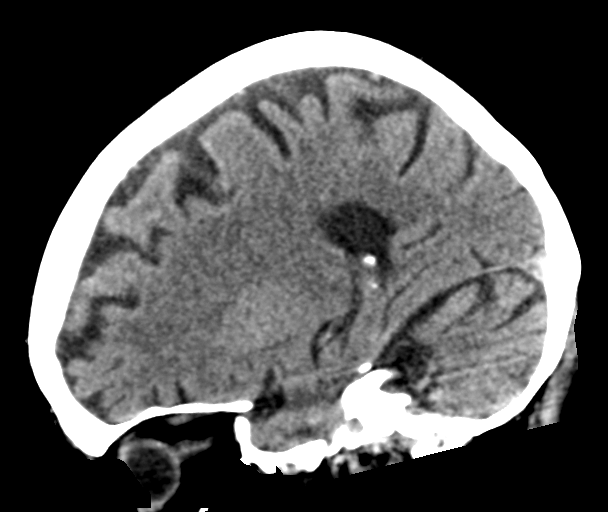
[im 23/46  brain]
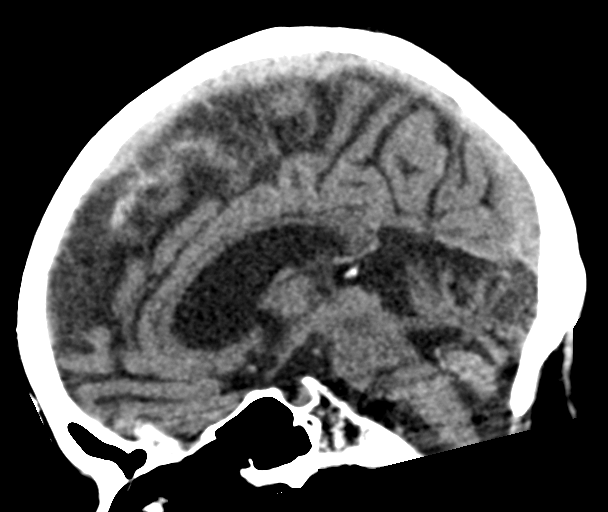
[im 31/46  brain]
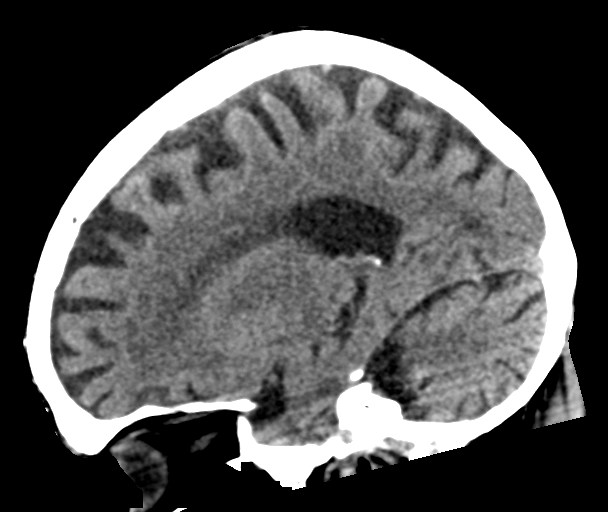

[16 of 47 positions shown; findings below may reference images not displayed]

FINDINGS: Brain: No evidence of acute infarction, hemorrhage, hydrocephalus,
extra-axial collection or mass lesion/mass effect. There is atrophy
greater than expected for the patient's age.

Vascular: No hyperdense vessel or unexpected calcification.

Skull: Normal. Negative for fracture or focal lesion.

Sinuses/Orbits: No acute finding.

Other: None.
IMPRESSION: No acute intracranial abnormality.
